# Patient Record
Sex: Male | Born: 1985 | Race: White | Hispanic: No | Marital: Single | Smoking: Current every day smoker
Health system: Southern US, Community
[De-identification: ages and names within clinical notes are randomized; demographics above are authoritative.]

## PROBLEM LIST (undated history)

## (undated) DIAGNOSIS — F1120 Opioid dependence, uncomplicated: Secondary | ICD-10-CM

## (undated) DIAGNOSIS — I2699 Other pulmonary embolism without acute cor pulmonale: Secondary | ICD-10-CM

## (undated) DIAGNOSIS — R569 Unspecified convulsions: Secondary | ICD-10-CM

## (undated) DIAGNOSIS — F1010 Alcohol abuse, uncomplicated: Secondary | ICD-10-CM

## (undated) DIAGNOSIS — F5104 Psychophysiologic insomnia: Secondary | ICD-10-CM

## (undated) HISTORY — PX: MIDDLE EAR SURGERY: SHX713

## (undated) HISTORY — DX: Psychophysiologic insomnia: F51.04

---
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
## 2009-05-30 ENCOUNTER — Emergency Department (HOSPITAL_COMMUNITY): Admission: EM | Admit: 2009-05-30 | Discharge: 2009-05-30 | Payer: Self-pay | Admitting: Emergency Medicine

## 2009-06-05 ENCOUNTER — Ambulatory Visit (HOSPITAL_COMMUNITY): Admission: RE | Admit: 2009-06-05 | Discharge: 2009-06-05 | Payer: Self-pay | Admitting: Oral Surgery

## 2009-06-25 ENCOUNTER — Ambulatory Visit (HOSPITAL_COMMUNITY): Admission: RE | Admit: 2009-06-25 | Discharge: 2009-06-25 | Payer: Self-pay | Admitting: Oral Surgery

## 2009-07-31 ENCOUNTER — Ambulatory Visit: Payer: Self-pay | Admitting: Diagnostic Radiology

## 2009-07-31 ENCOUNTER — Emergency Department (HOSPITAL_BASED_OUTPATIENT_CLINIC_OR_DEPARTMENT_OTHER): Admission: EM | Admit: 2009-07-31 | Discharge: 2009-07-31 | Payer: Self-pay | Admitting: Emergency Medicine

## 2009-08-05 ENCOUNTER — Encounter: Payer: Self-pay | Admitting: Internal Medicine

## 2009-08-20 ENCOUNTER — Ambulatory Visit: Payer: Self-pay | Admitting: Internal Medicine

## 2009-08-20 DIAGNOSIS — G40909 Epilepsy, unspecified, not intractable, without status epilepticus: Secondary | ICD-10-CM

## 2009-08-20 DIAGNOSIS — R079 Chest pain, unspecified: Secondary | ICD-10-CM

## 2009-08-20 DIAGNOSIS — F17200 Nicotine dependence, unspecified, uncomplicated: Secondary | ICD-10-CM

## 2009-08-20 DIAGNOSIS — Z8669 Personal history of other diseases of the nervous system and sense organs: Secondary | ICD-10-CM | POA: Insufficient documentation

## 2009-08-20 DIAGNOSIS — R030 Elevated blood-pressure reading, without diagnosis of hypertension: Secondary | ICD-10-CM

## 2009-08-24 ENCOUNTER — Emergency Department (HOSPITAL_COMMUNITY): Admission: EM | Admit: 2009-08-24 | Discharge: 2009-08-24 | Payer: Self-pay | Admitting: Family Medicine

## 2009-08-24 ENCOUNTER — Ambulatory Visit: Payer: Self-pay | Admitting: Cardiology

## 2009-08-24 ENCOUNTER — Ambulatory Visit (HOSPITAL_COMMUNITY): Admission: RE | Admit: 2009-08-24 | Discharge: 2009-08-24 | Payer: Self-pay | Admitting: Internal Medicine

## 2009-08-24 ENCOUNTER — Ambulatory Visit: Payer: Self-pay

## 2009-08-24 ENCOUNTER — Encounter: Payer: Self-pay | Admitting: Internal Medicine

## 2009-08-25 ENCOUNTER — Emergency Department (HOSPITAL_COMMUNITY): Admission: EM | Admit: 2009-08-25 | Discharge: 2009-08-25 | Payer: Self-pay | Admitting: Emergency Medicine

## 2010-01-27 ENCOUNTER — Emergency Department (HOSPITAL_COMMUNITY)
Admission: EM | Admit: 2010-01-27 | Discharge: 2010-01-27 | Payer: Self-pay | Source: Home / Self Care | Admitting: Family Medicine

## 2010-06-08 NOTE — Letter (Signed)
Summary: [facility] Office Note  [facility] Office Note   Imported By: Roderic Ovens 09/04/2009 11:46:49  _____________________________________________________________________  External Attachment:    Type:   Image     Comment:   External Document
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
## 2010-07-11 ENCOUNTER — Emergency Department (HOSPITAL_COMMUNITY)
Admission: EM | Admit: 2010-07-11 | Discharge: 2010-07-11 | Disposition: A | Discharge status: Home / Self Care | Payer: Self-pay | Attending: Emergency Medicine | Admitting: Emergency Medicine

## 2010-07-11 ENCOUNTER — Emergency Department (HOSPITAL_COMMUNITY): Payer: Self-pay

## 2010-07-11 DIAGNOSIS — X500XXA Overexertion from strenuous movement or load, initial encounter: Secondary | ICD-10-CM | POA: Insufficient documentation

## 2010-07-11 DIAGNOSIS — S93609A Unspecified sprain of unspecified foot, initial encounter: Secondary | ICD-10-CM | POA: Insufficient documentation

## 2010-07-26 LAB — CBC
MCHC: 34.5 g/dL (ref 30.0–36.0)
MCV: 92.6 fL (ref 78.0–100.0)
RBC: 4.82 MIL/uL (ref 4.22–5.81)
WBC: 9.1 10*3/uL (ref 4.0–10.5)

## 2010-07-26 LAB — BASIC METABOLIC PANEL
CO2: 26 mEq/L (ref 19–32)
Chloride: 102 mEq/L (ref 96–112)
Sodium: 136 mEq/L (ref 135–145)

## 2010-07-27 LAB — DIFFERENTIAL
Basophils Absolute: 0 10*3/uL (ref 0.0–0.1)
Eosinophils Absolute: 0.1 10*3/uL (ref 0.0–0.7)
Eosinophils Relative: 1 % (ref 0–5)
Lymphocytes Relative: 18 % (ref 12–46)
Lymphs Abs: 1.3 10*3/uL (ref 0.7–4.0)
Monocytes Absolute: 0.7 10*3/uL (ref 0.1–1.0)
Monocytes Relative: 9 % (ref 3–12)

## 2010-07-27 LAB — URINALYSIS, ROUTINE W REFLEX MICROSCOPIC
Bilirubin Urine: NEGATIVE
Glucose, UA: NEGATIVE mg/dL
Ketones, ur: NEGATIVE mg/dL
Leukocytes, UA: NEGATIVE
Specific Gravity, Urine: 1.02 (ref 1.005–1.030)
pH: 7 (ref 5.0–8.0)

## 2010-07-27 LAB — CBC
HCT: 42.7 % (ref 39.0–52.0)
MCV: 91 fL (ref 78.0–100.0)
RBC: 4.69 MIL/uL (ref 4.22–5.81)
WBC: 7.3 10*3/uL (ref 4.0–10.5)

## 2010-07-27 LAB — RAPID URINE DRUG SCREEN, HOSP PERFORMED
Amphetamines: NOT DETECTED
Barbiturates: NOT DETECTED
Benzodiazepines: POSITIVE — AB

## 2010-07-27 LAB — COMPREHENSIVE METABOLIC PANEL
AST: 30 U/L (ref 0–37)
Alkaline Phosphatase: 43 U/L (ref 39–117)
CO2: 27 mEq/L (ref 19–32)
Chloride: 106 mEq/L (ref 96–112)
Creatinine, Ser: 0.98 mg/dL (ref 0.4–1.5)
Total Bilirubin: 0.8 mg/dL (ref 0.3–1.2)

## 2010-08-02 LAB — CBC
MCHC: 34.2 g/dL (ref 30.0–36.0)
Platelets: 255 10*3/uL (ref 150–400)
RDW: 12.6 % (ref 11.5–15.5)

## 2010-08-02 LAB — POCT TOXICOLOGY PANEL

## 2010-08-02 LAB — BASIC METABOLIC PANEL
CO2: 26 mEq/L (ref 19–32)
Calcium: 9.7 mg/dL (ref 8.4–10.5)
Creatinine, Ser: 1 mg/dL (ref 0.4–1.5)
GFR calc Af Amer: >60 mL/min (ref >60)
Glucose, Bld: 104 mg/dL — ABNORMAL HIGH (ref 70–99)

## 2010-08-02 LAB — DIFFERENTIAL
Basophils Absolute: 0 10*3/uL (ref 0.0–0.1)
Basophils Relative: 1 % (ref 0–1)
Neutro Abs: 5.8 10*3/uL (ref 1.7–7.7)
Neutrophils Relative %: 76 % (ref 43–77)

## 2010-08-02 LAB — ETHANOL: Alcohol, Ethyl (B): <5 mg/dL (ref 0–10)

## 2012-05-07 ENCOUNTER — Encounter (HOSPITAL_COMMUNITY): Payer: Self-pay | Admitting: *Deleted

## 2012-05-07 ENCOUNTER — Emergency Department (HOSPITAL_COMMUNITY)
Admission: EM | Admit: 2012-05-07 | Discharge: 2012-05-07 | Disposition: A | Discharge status: Home / Self Care | Payer: Self-pay | Attending: Emergency Medicine | Admitting: Emergency Medicine

## 2012-05-07 ENCOUNTER — Emergency Department (HOSPITAL_COMMUNITY): Payer: Self-pay

## 2012-05-07 DIAGNOSIS — Z8669 Personal history of other diseases of the nervous system and sense organs: Secondary | ICD-10-CM | POA: Insufficient documentation

## 2012-05-07 DIAGNOSIS — S6291XA Unspecified fracture of right wrist and hand, initial encounter for closed fracture: Secondary | ICD-10-CM

## 2012-05-07 DIAGNOSIS — Y929 Unspecified place or not applicable: Secondary | ICD-10-CM | POA: Insufficient documentation

## 2012-05-07 DIAGNOSIS — F17200 Nicotine dependence, unspecified, uncomplicated: Secondary | ICD-10-CM | POA: Insufficient documentation

## 2012-05-07 DIAGNOSIS — S62309A Unspecified fracture of unspecified metacarpal bone, initial encounter for closed fracture: Secondary | ICD-10-CM | POA: Insufficient documentation

## 2012-05-07 DIAGNOSIS — W2209XA Striking against other stationary object, initial encounter: Secondary | ICD-10-CM | POA: Insufficient documentation

## 2012-05-07 DIAGNOSIS — Y9389 Activity, other specified: Secondary | ICD-10-CM | POA: Insufficient documentation

## 2012-05-07 HISTORY — DX: Unspecified convulsions: R56.9

## 2012-05-07 MED ORDER — IBUPROFEN 800 MG PO TABS
800.0000 mg | ORAL_TABLET | Freq: Once | ORAL | Status: AC
  Administered 2012-05-07: 800 mg via ORAL
  Filled 2012-05-07: qty 1
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
## 2013-02-06 ENCOUNTER — Encounter (HOSPITAL_COMMUNITY): Payer: Self-pay | Admitting: *Deleted

## 2013-02-06 ENCOUNTER — Emergency Department (HOSPITAL_COMMUNITY)
Admission: EM | Admit: 2013-02-06 | Discharge: 2013-02-06 | Disposition: A | Discharge status: Home / Self Care | Payer: Self-pay | Attending: Emergency Medicine | Admitting: Emergency Medicine

## 2013-02-06 DIAGNOSIS — K029 Dental caries, unspecified: Secondary | ICD-10-CM | POA: Insufficient documentation

## 2013-02-06 DIAGNOSIS — K089 Disorder of teeth and supporting structures, unspecified: Secondary | ICD-10-CM | POA: Insufficient documentation

## 2013-02-06 DIAGNOSIS — F17200 Nicotine dependence, unspecified, uncomplicated: Secondary | ICD-10-CM | POA: Insufficient documentation

## 2013-02-06 DIAGNOSIS — K0889 Other specified disorders of teeth and supporting structures: Secondary | ICD-10-CM

## 2013-02-06 DIAGNOSIS — Z8669 Personal history of other diseases of the nervous system and sense organs: Secondary | ICD-10-CM | POA: Insufficient documentation

## 2013-02-06 DIAGNOSIS — Z880 Allergy status to penicillin: Secondary | ICD-10-CM | POA: Insufficient documentation

## 2013-02-06 MED ORDER — CLINDAMYCIN HCL 150 MG PO CAPS: 300.0000 mg | ORAL_CAPSULE | Freq: Four times a day (QID) | ORAL | Status: DC

## 2013-02-06 MED ORDER — IBUPROFEN 800 MG PO TABS: 800.0000 mg | ORAL_TABLET | Freq: Three times a day (TID) | ORAL | Status: DC

## 2013-02-06 NOTE — ED Notes (Signed)
Pt with left dental pain x 2 weeks, states pain radiates to left jaw and ear

## 2013-02-06 NOTE — ED Provider Notes (Signed)
CSN: [id number]     Arrival date & time 02/06/13  1241 History   First MD Initiated Contact with Patient 02/06/13 1317     Chief Complaint  Patient presents with  . Dental Pain   (Consider location/radiation/quality/duration/timing/severity/associated sxs/prior Treatment) Patient is a 27 y.o. male presenting with tooth pain. The history is provided by the patient.  Dental Pain Location:  Lower Lower teeth location:  18/LL 2nd molar Quality:  Dull and throbbing Severity:  Moderate Onset quality:  Gradual Duration:  2 weeks Timing:  Constant Progression:  Worsening Chronicity:  New Context: dental caries and poor dentition   Context: not abscess, filling still in place, not recent dental surgery and not trauma   Relieved by:  Nothing Worsened by:  Cold food/drink and hot food/drink Ineffective treatments:  Acetaminophen Associated symptoms: no congestion, no difficulty swallowing, no facial pain, no facial swelling, no fever, no gum swelling, no headaches, no neck pain, no neck swelling, no oral lesions and no trismus   Associated symptoms comment:  Left ear pain Risk factors: lack of dental care, periodontal disease and smoking   Risk factors: no diabetes     Past Medical History  Diagnosis Date  . Seizures    Past Surgical History  Procedure Laterality Date  . Middle ear surgery     History reviewed. No pertinent family history. History  Substance Use Topics  . Smoking status: Current Every Day Smoker    Types: Cigarettes  . Smokeless tobacco: Not on file  . Alcohol Use: No    Review of Systems  Constitutional: Negative for fever and appetite change.  HENT: Positive for dental problem. Negative for congestion, sore throat, facial swelling, mouth sores, trouble swallowing, neck pain and neck stiffness.   Eyes: Negative for pain and visual disturbance.  Neurological: Negative for dizziness, facial asymmetry and headaches.  Hematological: Negative for adenopathy.    All other systems reviewed and are negative.    Allergies  Penicillins  Home Medications  No current outpatient prescriptions on file. BP 145/81  Pulse 73  Temp(Src) 98.1 F (36.7 C) (Oral)  Resp 18  Ht 5\' 11"  (1.803 m)  Wt 190 lb (86.183 kg)  BMI 26.51 kg/m2  SpO2 100% Physical Exam  Nursing note and vitals reviewed. Constitutional: He is oriented to person, place, and time. He appears well-developed and well-nourished. No distress.  HENT:  Head: Normocephalic and atraumatic.  Right Ear: Tympanic membrane and ear canal normal.  Left Ear: Tympanic membrane and ear canal normal.  Mouth/Throat: Uvula is midline, oropharynx is clear and moist and mucous membranes are normal. No trismus in the jaw. Dental caries present. No dental abscesses or edematous.    Dental caries of the left lower molar.  No facial swelling, obvious dental abscess, trismus, or sublingual abnml.    Neck: Normal range of motion. Neck supple.  Cardiovascular: Normal rate, regular rhythm and normal heart sounds.   No murmur heard. Pulmonary/Chest: Effort normal and breath sounds normal.  Musculoskeletal: Normal range of motion.  Lymphadenopathy:    He has no cervical adenopathy.  Neurological: He is alert and oriented to person, place, and time. He exhibits normal muscle tone. Coordination normal.  Skin: Skin is warm and dry.    ED Course  Procedures (including critical care time) Labs Review Labs Reviewed - No data to display Imaging Review No results found.  MDM   Vital signs are stable. Patient will appearing. Stable for discharge at this time. Given referral information  for dental followup.  No concerning sx's for Ludwig's angina.  Prescribed ibuprofen 800mg  and clindamycin   Alece Koppel L. Trisha Mangle, PA-C 02/06/13 2047

## 2013-02-09 NOTE — ED Provider Notes (Signed)
Medical screening examination/treatment/procedure(s) were performed by non-physician practitioner and as supervising physician I was immediately available for consultation/collaboration.  Abdifatah Colquhoun M Kassidee Narciso, MD 02/09/13 0858 

## 2014-09-10 ENCOUNTER — Emergency Department (HOSPITAL_COMMUNITY)
Admission: EM | Admit: 2014-09-10 | Discharge: 2014-09-10 | Disposition: A | Discharge status: Home / Self Care | Payer: 59 | Attending: Emergency Medicine | Admitting: Emergency Medicine

## 2014-09-10 ENCOUNTER — Encounter (HOSPITAL_COMMUNITY): Payer: Self-pay

## 2014-09-10 DIAGNOSIS — R569 Unspecified convulsions: Secondary | ICD-10-CM | POA: Diagnosis present

## 2014-09-10 DIAGNOSIS — Z720 Tobacco use: Secondary | ICD-10-CM | POA: Insufficient documentation

## 2014-09-10 DIAGNOSIS — Z791 Long term (current) use of non-steroidal anti-inflammatories (NSAID): Secondary | ICD-10-CM | POA: Insufficient documentation

## 2014-09-10 DIAGNOSIS — Z792 Long term (current) use of antibiotics: Secondary | ICD-10-CM | POA: Insufficient documentation

## 2014-09-10 DIAGNOSIS — G40909 Epilepsy, unspecified, not intractable, without status epilepticus: Secondary | ICD-10-CM | POA: Insufficient documentation

## 2014-09-10 DIAGNOSIS — Z880 Allergy status to penicillin: Secondary | ICD-10-CM | POA: Diagnosis not present

## 2014-09-10 LAB — URINALYSIS, ROUTINE W REFLEX MICROSCOPIC
BILIRUBIN URINE: NEGATIVE
GLUCOSE, UA: NEGATIVE mg/dL
Leukocytes, UA: NEGATIVE
Nitrite: NEGATIVE
PH: 6 (ref 5.0–8.0)
Protein, ur: 100 mg/dL — AB
Specific Gravity, Urine: >1.03 — ABNORMAL HIGH (ref 1.005–1.030)
Urobilinogen, UA: 0.2 mg/dL (ref 0.0–1.0)

## 2014-09-10 LAB — CBC WITH DIFFERENTIAL/PLATELET
Basophils Absolute: 0 10*3/uL (ref 0.0–0.1)
Basophils Relative: 0 % (ref 0–1)
Eosinophils Absolute: 0 10*3/uL (ref 0.0–0.7)
Eosinophils Relative: 0 % (ref 0–5)
HCT: 46.3 % (ref 39.0–52.0)
Hemoglobin: 16.1 g/dL (ref 13.0–17.0)
LYMPHS ABS: 0.9 10*3/uL (ref 0.7–4.0)
LYMPHS PCT: 8 % — AB (ref 12–46)
MCH: 31.4 pg (ref 26.0–34.0)
MCHC: 34.8 g/dL (ref 30.0–36.0)
MCV: 90.4 fL (ref 78.0–100.0)
Monocytes Absolute: 0.8 10*3/uL (ref 0.1–1.0)
Monocytes Relative: 7 % (ref 3–12)
NEUTROS PCT: 85 % — AB (ref 43–77)
Neutro Abs: 9.8 10*3/uL — ABNORMAL HIGH (ref 1.7–7.7)
PLATELETS: 233 10*3/uL (ref 150–400)
RBC: 5.12 MIL/uL (ref 4.22–5.81)
RDW: 13.1 % (ref 11.5–15.5)
WBC: 11.6 10*3/uL — ABNORMAL HIGH (ref 4.0–10.5)

## 2014-09-10 LAB — COMPREHENSIVE METABOLIC PANEL
ALBUMIN: 4.7 g/dL (ref 3.5–5.0)
ALK PHOS: 38 U/L (ref 38–126)
ALT: 23 U/L (ref 17–63)
ANION GAP: 8 (ref 5–15)
AST: 30 U/L (ref 15–41)
BUN: 13 mg/dL (ref 6–20)
CO2: 25 mmol/L (ref 22–32)
Calcium: 9.2 mg/dL (ref 8.9–10.3)
Chloride: 105 mmol/L (ref 101–111)
Creatinine, Ser: 1.01 mg/dL (ref 0.61–1.24)
GFR calc Af Amer: >60 mL/min (ref >60)
GFR calc non Af Amer: >60 mL/min (ref >60)
Glucose, Bld: 137 mg/dL — ABNORMAL HIGH (ref 70–99)
POTASSIUM: 4.2 mmol/L (ref 3.5–5.1)
SODIUM: 138 mmol/L (ref 135–145)
TOTAL PROTEIN: 7.2 g/dL (ref 6.5–8.1)
Total Bilirubin: 1 mg/dL (ref 0.3–1.2)

## 2014-09-10 LAB — URINE MICROSCOPIC-ADD ON

## 2014-09-10 LAB — CBG MONITORING, ED: Glucose-Capillary: 131 mg/dL — ABNORMAL HIGH (ref 70–99)

## 2014-09-10 MED ORDER — PHENYTOIN SODIUM EXTENDED 100 MG PO CAPS: 300.0000 mg | ORAL_CAPSULE | Freq: Every day | ORAL | Status: DC

## 2014-09-10 MED ORDER — PHENYTOIN SODIUM EXTENDED 100 MG PO CAPS
400.0000 mg | ORAL_CAPSULE | ORAL | Status: DC
  Administered 2014-09-10: 400 mg via ORAL
  Filled 2014-09-10: qty 4

## 2014-09-10 NOTE — ED Provider Notes (Signed)
CSN: [id number]     Arrival date & time 09/10/14  1344 History   First MD Initiated Contact with Patient 09/10/14 1409     Chief Complaint  Patient presents with  . Seizures     Patient is a 29 y.o. male presenting with seizures. The history is provided by the patient. No language interpreter was used.  Seizures  Ryan Fernandez has a history of seizure disorder. Today he presents for 3 seizures. He had one this morning around 8 AM that was witnessed by his girlfriend. The description is that it was a generalized seizure where he struck his head. It is unclear how long it lasted. He did not have any tongue biting or incontinence. He reports feeling sore all over. About an hour later he had one to 2 more seizures. These were unwitnessed. He is supposed to be on Dilantin but that stopped that about a year ago due to financial problems. He has a history of seizure disorder since the age of 29. His last seizure was 2 years ago. He reports no recent illnesses but has been under increased stress and is sleeping less lately. He has a regular alcohol drinker and drinks about a 6 pack daily. He has no history of alcohol withdrawal.  Past Medical History  Diagnosis Date  . Seizures    Past Surgical History  Procedure Laterality Date  . Middle ear surgery     History reviewed. No pertinent family history. History  Substance Use Topics  . Smoking status: Current Every Day Smoker    Types: Cigarettes  . Smokeless tobacco: Not on file  . Alcohol Use: Yes     Comment: daily 6 packs - but none since Monday    Review of Systems  Neurological: Positive for seizures.  All other systems reviewed and are negative.     Allergies  Penicillins  Home Medications   Prior to Admission medications   Medication Sig Start Date End Date Taking? Authorizing Provider  Buprenorphine HCl-Naloxone HCl (SUBOXONE SL) Place under the tongue.   Yes Historical Provider, MD  Escitalopram Oxalate (LEXAPRO PO) Take by  mouth.   Yes Historical Provider, MD  TraZODone & Diet Manage Prod (TRAZAMINE PO) Take by mouth.   Yes Historical Provider, MD  clindamycin (CLEOCIN) 150 MG capsule Take 2 capsules (300 mg total) by mouth 4 (four) times daily. 02/06/13   Tammy Triplett, PA-C  ibuprofen (ADVIL,MOTRIN) 800 MG tablet Take 1 tablet (800 mg total) by mouth 3 (three) times daily. 02/06/13   Tammy Triplett, PA-C   BP 136/91 mmHg  Pulse 84  Temp(Src) 98.2 F (36.8 C) (Oral)  Resp 16  Ht 5\' 11"  (1.803 m)  Wt 170 lb (77.111 kg)  BMI 23.72 kg/m2  SpO2 97% Physical Exam  Constitutional: He is oriented to person, place, and time. He appears well-developed and well-nourished.  HENT:  Head: Normocephalic and atraumatic.  Mouth/Throat: Oropharynx is clear and moist.  Eyes: EOM are normal. Pupils are equal, round, and reactive to light.  Cardiovascular: Normal rate and regular rhythm.   No murmur heard. Pulmonary/Chest: Effort normal and breath sounds normal. No respiratory distress.  Abdominal: Soft. There is no tenderness. There is no rebound and no guarding.  Musculoskeletal: He exhibits no edema or tenderness.  Neurological: He is alert and oriented to person, place, and time. No cranial nerve deficit.  5 out of 5 strength in all 4 extremities.  Skin: Skin is warm and dry.  Psychiatric: He has a  normal mood and affect. His behavior is normal.  Nursing note and vitals reviewed.   ED Course  Procedures (including critical care time) Labs Review Labs Reviewed  COMPREHENSIVE METABOLIC PANEL - Abnormal; Notable for the following:    Glucose, Bld 137 (*)    All other components within normal limits  CBC WITH DIFFERENTIAL/PLATELET - Abnormal; Notable for the following:    WBC 11.6 (*)    Neutrophils Relative % 85 (*)    Neutro Abs 9.8 (*)    Lymphocytes Relative 8 (*)    All other components within normal limits  CBG MONITORING, ED - Abnormal; Notable for the following:    Glucose-Capillary 131 (*)    All  other components within normal limits  URINALYSIS, ROUTINE W REFLEX MICROSCOPIC    Imaging Review No results found.   EKG Interpretation   Date/Time:  Wednesday Sep 10 2014 13:54:34 EDT Ventricular Rate:  74 PR Interval:  170 QRS Duration: 92 QT Interval:  388 QTC Calculation: 430 R Axis:   71 Text Interpretation:  Sinus rhythm Confirmed by Ryan Brighamees, Ryan Fernandez [phone]([id number]) on  09/10/2014 2:10:24 PM      MDM   Final diagnoses:  Seizure disorder    Patient here for evaluation of seizure, history of seizure disorder and not taking anticonvulsants. Patient has no evidence of acute significant trauma on examination. Plan to restart his Dilantin was loading the emergency department. Checking labs to evaluate for electrolyte abnormalities.  CMP with no major abnormalities. Patient without any complaints on repeat check. Discussed with patient oral Dilantin loading with outpatient follow-up. Discussed needing recheck of his Dilantin level in the next week. Return precautions were discussed. Discussed with patient that he is not able to drive and he needs to follow seizure precautions until he has been cleared by his primary care physician and has his Dilantin level rechecked.   Tilden FossaElizabeth Deionna Marcantonio, MD 09/10/14 [phone]

## 2014-09-10 NOTE — Discharge Instructions (Signed)

## 2014-09-10 NOTE — ED Notes (Addendum)
EMS reports family called EMS for possible seizure, ? Hitting head - pt semi-post ictal on arrival. Pt alert, oriented, appropriate, verbal at this time. Pt states he feels better at this time but reports he had three seizures this morning = but had otherwise been seizure free for 2 years - pt reports being on Dilantin in the past, states he quit taking it due to personal reasons.  PT with 18G to LAC by EMS that is patent, flushes well, no edema, no redness. Reports being under a lot of stress with current relationship. PT also reports he has been taking one xanax daily at work x1 month but has not had one in a week.

## 2014-09-10 NOTE — Progress Notes (Signed)
MEDICATION RELATED CONSULT NOTE - INITIAL   Pharmacy Consult for Dilantin Indication: Seizure disorder  Allergies  Allergen Reactions  . Penicillins    Patient Measurements: Height: 5\' 11"  (180.3 cm) Weight: 170 lb (77.111 kg) IBW/kg (Calculated) : 75.3  Vital Signs: Temp: 98.2 F (36.8 C) (05/04 1349) Temp Source: Oral (05/04 1349) BP: 136/91 mmHg (05/04 1349) Pulse Rate: 84 (05/04 1349) Intake/Output from previous day:   Intake/Output from this shift:    Labs: No results for input(s): WBC, HGB, HCT, PLT, APTT, CREATININE, LABCREA, CREATININE, CREAT24HRUR, MG, PHOS, ALBUMIN, PROT, ALBUMIN, AST, ALT, ALKPHOS, BILITOT, BILIDIR, IBILI in the last 72 hours. CrCl cannot be calculated (Patient has no serum creatinine result on file.).  Microbiology: No results found for this or any previous visit (from the past 720 hour(s)).  Medical History: Past Medical History  Diagnosis Date  . Seizures    Medications:   (Not in a hospital admission)  Assessment: Ryan Fernandez with h/o seizure disorder.  Brought to ED today by EMS for possible seizure.  Pt states he had 3 seizures this morning but prior to this has been seizure free for 2 years.  Pt reports having been on Dilantin in the past but quit taking it due to personal reasons.   Labs on order > Pending  Goal of Therapy:  Corrected Dilantin level 10-20 mcg/ml  Plan:  Dilantin Loading dose PO: 400mg  PO q3h x 3 doses (1200mg  total = ~ 15mg /Kg) then Dilantin 300mg  PO daily at bedtime starting TOMORROW (09/11/14) Recommend checking a Dilantin level and Albumin in 5-7 days F/U labs, LFTs, Renal fxn, albumin  Margo AyeHall, Khamari Yousuf A 09/10/2014,2:29 PM

## 2014-11-16 ENCOUNTER — Emergency Department (HOSPITAL_COMMUNITY): Payer: 59

## 2014-11-16 ENCOUNTER — Encounter (HOSPITAL_COMMUNITY): Payer: Self-pay | Admitting: *Deleted

## 2014-11-16 ENCOUNTER — Emergency Department (HOSPITAL_COMMUNITY)
Admission: EM | Admit: 2014-11-16 | Discharge: 2014-11-18 | Disposition: A | Discharge status: Home / Self Care | Payer: 59 | Attending: Emergency Medicine | Admitting: Emergency Medicine

## 2014-11-16 DIAGNOSIS — G40909 Epilepsy, unspecified, not intractable, without status epilepticus: Secondary | ICD-10-CM | POA: Diagnosis not present

## 2014-11-16 DIAGNOSIS — Z880 Allergy status to penicillin: Secondary | ICD-10-CM | POA: Insufficient documentation

## 2014-11-16 DIAGNOSIS — Z008 Encounter for other general examination: Secondary | ICD-10-CM | POA: Diagnosis present

## 2014-11-16 DIAGNOSIS — Z79899 Other long term (current) drug therapy: Secondary | ICD-10-CM | POA: Diagnosis not present

## 2014-11-16 DIAGNOSIS — F10129 Alcohol abuse with intoxication, unspecified: Secondary | ICD-10-CM | POA: Diagnosis not present

## 2014-11-16 DIAGNOSIS — F1310 Sedative, hypnotic or anxiolytic abuse, uncomplicated: Secondary | ICD-10-CM | POA: Insufficient documentation

## 2014-11-16 DIAGNOSIS — Z720 Tobacco use: Secondary | ICD-10-CM | POA: Insufficient documentation

## 2014-11-16 DIAGNOSIS — F10929 Alcohol use, unspecified with intoxication, unspecified: Secondary | ICD-10-CM

## 2014-11-16 DIAGNOSIS — F1210 Cannabis abuse, uncomplicated: Secondary | ICD-10-CM | POA: Diagnosis not present

## 2014-11-16 DIAGNOSIS — E162 Hypoglycemia, unspecified: Secondary | ICD-10-CM | POA: Insufficient documentation

## 2014-11-16 LAB — RAPID URINE DRUG SCREEN, HOSP PERFORMED
Amphetamines: NOT DETECTED
BARBITURATES: NOT DETECTED
Benzodiazepines: POSITIVE — AB
Cocaine: NOT DETECTED
Opiates: NOT DETECTED
Tetrahydrocannabinol: POSITIVE — AB

## 2014-11-16 LAB — CBC WITH DIFFERENTIAL/PLATELET
Basophils Absolute: 0 10*3/uL (ref 0.0–0.1)
Basophils Relative: 0 % (ref 0–1)
Eosinophils Absolute: 0.1 10*3/uL (ref 0.0–0.7)
Eosinophils Relative: 1 % (ref 0–5)
HCT: 43.3 % (ref 39.0–52.0)
HEMOGLOBIN: 14.9 g/dL (ref 13.0–17.0)
LYMPHS ABS: 2.2 10*3/uL (ref 0.7–4.0)
LYMPHS PCT: 33 % (ref 12–46)
MCH: 31.6 pg (ref 26.0–34.0)
MCHC: 34.4 g/dL (ref 30.0–36.0)
MCV: 91.9 fL (ref 78.0–100.0)
MONO ABS: 0.8 10*3/uL (ref 0.1–1.0)
MONOS PCT: 11 % (ref 3–12)
NEUTROS ABS: 3.6 10*3/uL (ref 1.7–7.7)
Neutrophils Relative %: 55 % (ref 43–77)
Platelets: 256 10*3/uL (ref 150–400)
RBC: 4.71 MIL/uL (ref 4.22–5.81)
RDW: 13.7 % (ref 11.5–15.5)
WBC: 6.7 10*3/uL (ref 4.0–10.5)

## 2014-11-16 LAB — BASIC METABOLIC PANEL
ANION GAP: 9 (ref 5–15)
BUN: 9 mg/dL (ref 6–20)
CALCIUM: 8.5 mg/dL — AB (ref 8.9–10.3)
CO2: 26 mmol/L (ref 22–32)
CREATININE: 0.94 mg/dL (ref 0.61–1.24)
Chloride: 108 mmol/L (ref 101–111)
GFR calc Af Amer: >60 mL/min (ref >60)
Glucose, Bld: 88 mg/dL (ref 65–99)
Potassium: 3.6 mmol/L (ref 3.5–5.1)
SODIUM: 143 mmol/L (ref 135–145)

## 2014-11-16 LAB — ETHANOL: Alcohol, Ethyl (B): 265 mg/dL — ABNORMAL HIGH (ref <5)

## 2014-11-16 LAB — CBG MONITORING, ED
GLUCOSE-CAPILLARY: 58 mg/dL — AB (ref 65–99)
Glucose-Capillary: 113 mg/dL — ABNORMAL HIGH (ref 65–99)

## 2014-11-16 MED ORDER — SODIUM CHLORIDE 0.9 % IV BOLUS (SEPSIS)
1000.0000 mL | Freq: Once | INTRAVENOUS | Status: AC
  Administered 2014-11-16: 1000 mL via INTRAVENOUS

## 2014-11-16 MED ORDER — DEXTROSE 50 % IV SOLN
25.0000 g | Freq: Once | INTRAVENOUS | Status: AC
  Administered 2014-11-16: 25 g via INTRAVENOUS

## 2014-11-16 MED ORDER — NALOXONE HCL 0.4 MG/ML IJ SOLN
0.4000 mg | Freq: Once | INTRAMUSCULAR | Status: AC
  Administered 2014-11-16: 0.4 mg via INTRAVENOUS
  Filled 2014-11-16: qty 1

## 2014-11-16 MED ORDER — DEXTROSE 50 % IV SOLN
INTRAVENOUS | Status: AC
  Filled 2014-11-16: qty 50

## 2014-11-16 MED ORDER — AMMONIA AROMATIC IN INHA
RESPIRATORY_TRACT | Status: AC
  Filled 2014-11-16: qty 10

## 2014-11-16 NOTE — ED Notes (Signed)
Pt brought in by [facility] for wanting help to get off prescription meds; pt told ems that he had took sobaxin for 7 days and has been with out any meds for the last 7 days as well; ems states pt was alert and oriented when he was picked up but upon arrival to ED pt is non-verbal, unresponsive and responsive only to ammonia inhalent;

## 2014-11-16 NOTE — ED Notes (Signed)
Pt given crackers, peanut butter & a drink after CBG reading of 58.

## 2014-11-16 NOTE — ED Notes (Signed)
White substance noted in the right nare.

## 2014-11-16 NOTE — ED Provider Notes (Addendum)
CSN: [id number]     Arrival date & time 11/16/14  1940 History   First MD Initiated Contact with Patient 11/16/14 1943     Chief Complaint  Patient presents with  . Medical Clearance     (Consider location/radiation/quality/duration/timing/severity/associated sxs/prior Treatment) HPI..... Level V caveat for altered mental status. According to EMS report, patient had been on Ativan, Versed, unknown pain medicine. This was discontinued recently and patient was put on Suboxone. It is uncertain whether he is still taking Suboxone. He did claim to take Xanax today. Uncertain alcohol history. Past medical history unknown  Past Medical History  Diagnosis Date  . Seizures    Past Surgical History  Procedure Laterality Date  . Middle ear surgery     History reviewed. No pertinent family history. History  Substance Use Topics  . Smoking status: Current Every Day Smoker    Types: Cigarettes  . Smokeless tobacco: Not on file  . Alcohol Use: Yes     Comment: daily 6 packs - but none since Monday    Review of Systems  Unable to perform ROS: Mental status change      Allergies  Penicillins  Home Medications   Prior to Admission medications   Medication Sig Start Date End Date Taking? Authorizing Provider  phenytoin (DILANTIN) 100 MG ER capsule Take 3 capsules (300 mg total) by mouth at bedtime. Today take four capsules when you fill the prescription and four capsules three hours later.  Tomorrow start taking 3 capsules (300mg ) by mouth every night. 09/10/14  Yes Tilden FossaElizabeth Rees, MD  Buprenorphine HCl-Naloxone HCl 8-2 MG FILM Place 1 Film under the tongue daily.    Historical Provider, MD  cloNIDine (CATAPRES) 0.1 MG tablet Take 0.1 mg by mouth 2 (two) times daily. 11/06/14   Historical Provider, MD  escitalopram (LEXAPRO) 20 MG tablet Take 20 mg by mouth daily. 08/15/14   Historical Provider, MD  ibuprofen (ADVIL,MOTRIN) 200 MG tablet Take 400-800 mg by mouth every 6 (six) hours as needed  for moderate pain.    Historical Provider, MD  naproxen sodium (ANAPROX) 220 MG tablet Take 440 mg by mouth daily as needed (pain).    Historical Provider, MD  traZODone (DESYREL) 50 MG tablet Take 50 mg by mouth at bedtime as needed for sleep.  08/15/14   Historical Provider, MD  ZUBSOLV 2.9-0.71 MG SUBL USE 1 TABLET SUBLINGUALLY ONCE A DAY 11/06/14   Historical Provider, MD   BP 119/83 mmHg  Pulse 77  Temp(Src) 97.8 F (36.6 C) (Oral)  Resp 20  Ht 5\' 11"  (1.803 m)  Wt 170 lb (77.111 kg)  BMI 23.72 kg/m2  SpO2 100% Physical Exam  Constitutional:  Patient was initially talking, but then he became sleepy and less arousable  HENT:  Head: Normocephalic and atraumatic.  Eyes: Conjunctivae are normal. Pupils are equal, round, and reactive to light.  Neck: Normal range of motion. Neck supple.  Cardiovascular: Normal rate and regular rhythm.   Pulmonary/Chest: Effort normal and breath sounds normal.  Abdominal: Soft. Bowel sounds are normal.  Musculoskeletal: Normal range of motion.  Neurological:  Unable  Skin: Skin is warm and dry.  Psychiatric:  Unable  Nursing note and vitals reviewed.   ED Course  Procedures (including critical care time) Labs Review Labs Reviewed  BASIC METABOLIC PANEL - Abnormal; Notable for the following:    Calcium 8.5 (*)    All other components within normal limits  URINE RAPID DRUG SCREEN, HOSP PERFORMED - Abnormal; Notable  for the following:    Benzodiazepines POSITIVE (*)    Tetrahydrocannabinol POSITIVE (*)    All other components within normal limits  ETHANOL - Abnormal; Notable for the following:    Alcohol, Ethyl (B) 265 (*)    All other components within normal limits  CBG MONITORING, ED - Abnormal; Notable for the following:    Glucose-Capillary 58 (*)    All other components within normal limits  CBC WITH DIFFERENTIAL/PLATELET    Imaging Review No results found.   EKG Interpretation None      MDM   Final diagnoses:  Alcohol  intoxication, with unspecified complication    Patient was initially verbal, but then he became nonverbal. Narcan given. This did not seem to help. Glucose level was 58. Intravenous glucose given. Alcohol level 265.  Drug screen positive for benzodiazepine's and THC.  I suspect patient's increased somnolence is secondary to alcohol. We'll obtain [facility] consult after patient sobers up.  CT head pending. Discussed with Dr. Anastasia Fiedler, MD 11/16/14 1610  Donnetta Hutching, MD 11/16/14 2156

## 2014-11-16 NOTE — ED Notes (Signed)
Removed 3 necklaces, watch, & 2 earrings. Clothing included jeans, socks, tennis shoes & shirt. Pt has cell phone & wallet that remain w/ pt belongings.

## 2014-11-16 NOTE — ED Notes (Addendum)
Pt states he has taken a couple of xanax today the last was at 1100, pt respond to verbal stimuli at this time. Says his Dr has taken him off all his meds. Finished the sobaxin. Wants help getting off prescription meds

## 2014-11-17 LAB — PHENYTOIN LEVEL, TOTAL

## 2014-11-17 MED ORDER — NICOTINE 14 MG/24HR TD PT24: 14.0000 mg | MEDICATED_PATCH | Freq: Once | TRANSDERMAL | Status: DC

## 2014-11-17 MED ORDER — NICOTINE 21 MG/24HR TD PT24
21.0000 mg | MEDICATED_PATCH | Freq: Once | TRANSDERMAL | Status: AC
  Administered 2014-11-17: 21 mg via TRANSDERMAL
  Filled 2014-11-17: qty 1

## 2014-11-17 MED ORDER — VITAMIN B-1 100 MG PO TABS
100.0000 mg | ORAL_TABLET | Freq: Every day | ORAL | Status: DC
Start: 2014-11-17 — End: 2014-11-18
  Administered 2014-11-17 – 2014-11-18 (×2): 100 mg via ORAL
  Filled 2014-11-17 (×2): qty 1

## 2014-11-17 MED ORDER — TRAZODONE HCL 50 MG PO TABS
50.0000 mg | ORAL_TABLET | Freq: Every evening | ORAL | Status: DC | PRN
  Administered 2014-11-17: 50 mg via ORAL
  Filled 2014-11-17: qty 1

## 2014-11-17 MED ORDER — ESCITALOPRAM OXALATE 10 MG PO TABS
20.0000 mg | ORAL_TABLET | Freq: Every day | ORAL | Status: DC
  Administered 2014-11-17 – 2014-11-18 (×2): 20 mg via ORAL
  Filled 2014-11-17: qty 1
  Filled 2014-11-17: qty 2
  Filled 2014-11-17: qty 1
  Filled 2014-11-17: qty 2

## 2014-11-17 MED ORDER — LORAZEPAM 1 MG PO TABS: 0.0000 mg | ORAL_TABLET | Freq: Two times a day (BID) | ORAL | Status: DC

## 2014-11-17 MED ORDER — LORAZEPAM 1 MG PO TABS
0.0000 mg | ORAL_TABLET | Freq: Four times a day (QID) | ORAL | Status: DC
  Administered 2014-11-17 (×2): 2 mg via ORAL
  Administered 2014-11-17: 1 mg via ORAL
  Administered 2014-11-17 – 2014-11-18 (×2): 2 mg via ORAL
  Filled 2014-11-17 (×3): qty 2
  Filled 2014-11-17: qty 1
  Filled 2014-11-17: qty 2

## 2014-11-17 MED ORDER — ACETAMINOPHEN 325 MG PO TABS
650.0000 mg | ORAL_TABLET | Freq: Once | ORAL | Status: AC
  Administered 2014-11-17: 650 mg via ORAL
  Filled 2014-11-17: qty 2

## 2014-11-17 MED ORDER — THIAMINE HCL 100 MG/ML IJ SOLN: 100.0000 mg | Freq: Every day | INTRAMUSCULAR | Status: DC

## 2014-11-17 MED ORDER — NICOTINE 21 MG/24HR TD PT24
21.0000 mg | MEDICATED_PATCH | Freq: Once | TRANSDERMAL | Status: DC
  Administered 2014-11-17: 21 mg via TRANSDERMAL
  Filled 2014-11-17: qty 1

## 2014-11-17 NOTE — BHH Counselor (Signed)
TTS Counselor sent referrals to the following facilities in effort to obtain Inpatient Detox/Suboxone/Opiate tx for pt:   [facility]

## 2014-11-17 NOTE — ED Notes (Signed)
Pt wanting to go outside & smoke. Pt informed that this is a smoke free facility & was unable to leave to smoke. Pt was asked if he would like a nicotine patch & states he would. Speaking to EDP about getting an order.
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
## 2014-11-17 NOTE — Progress Notes (Addendum)
[facility] the [facility], [facility] male beds [facility] on [facility]/18/14.  Patient was followed-up at at the following facilities: [facility] - per care coordinator Jarett, states that [facility] attending Dr. to [facility]man [facility] for doc to doc referral (due to patient having history of seizures and if patient compliant with medication)    Shift Supervisor - [phone] fax:[phone] [facility] - per Dennie BiblePat, fax referral for review. Referral faxed. [facility] intake, fax referral for review, beds open on 7/12 in am. [facility] - [facility].  Declined at: [facility] - due to hx of seisures OV - due to hx of seisures  Ryan Fernandez, LCSWA Disposition staff 11/17/2014 9:35 PM

## 2014-11-17 NOTE — ED Notes (Signed)
Pt ambulated to restroom & returned to room w/ no complications. 

## 2014-11-17 NOTE — ED Notes (Signed)
[facility] is [facility] to accept pt if he can bring medication and have a ride at discharge.  Pt verbalized that he could do both. [facility] notified.

## 2014-11-17 NOTE — ED Notes (Signed)
RN spoke with Ryan Fernandez at [facility] to answer intake questions. [phone]

## 2014-11-17 NOTE — Progress Notes (Signed)
CSW followed up on detox placement efforts.  [facility]- per Ray, refax referral to [phone] [facility]- per Sam, referral not received over weekend, [facility]ax [facility] [place], same as above [facility], same as above  [facility]- [facility] per French Anaracy  Received call from KevinAshley at [facility] requesting more medical information, directed call to [facility] charge RN.  Ilean SkillMeghan Debborah Alonge, MSW, LCSWA Clinical Social Work, Disposition  11/17/2014 [phone]

## 2014-11-17 NOTE — ED Provider Notes (Signed)
Ann, TSS, states they are going to recommend patient be admitted for detox, they will arrange. [facility].   Devoria AlbeIva Elishua Radford, MD, Concha PyoFACEP   Anabelen Kaminsky, MD 11/17/14 [phone]

## 2014-11-17 NOTE — BHH Counselor (Signed)
Disposition: Per Hulan FessIjeoma Nwaeze, NP, Pt meets inpt/detox tx criteria. TTS will seek placement for pt at appropriate detox facilities.   TTS Counselor informed Dr Lynelle DoctorKnapp of disposition and need for outside placement.   Cyndie MullAnna Bebe Moncure, [facility] Triage Specialist

## 2014-11-17 NOTE — Progress Notes (Signed)
Per Morrie SheldonAshley at [facility], pt declined due to hx of seizures.  Ray at [facility] called stating referral is being reviewed for admission, asked if pt could secure transportation home upon discharge and if pt would be able to bring supply of his psychotropic medications. CSW called [facility] and RN spoke with pt, who affirms both of the above. CSW returned call to [facility], and [facility] is unavailable at this time. Left message with staff member Cedric for Ray to return call at [phone] or [phone] when available to discuss pt's disposition.   Ilean SkillMeghan Frutoso Dimare, MSW, LCSWA Clinical Social Work, Disposition  11/17/2014 [phone]

## 2014-11-18 NOTE — ED Notes (Signed)
[facility] Dr[facility] Shawnie DapperLopez, call report to [phone].

## 2014-11-18 NOTE — Progress Notes (Signed)
Per Marena [facility]Bianca at [facility], pt is accepted for admission by Dr. Shawnie DapperLopez, report [phone]. Can be transported when ready. Admission is voluntary. Spoke with [facility] charge RN re: pt's placement.  Ilean SkillMeghan Ibn Stief, MSW, LCSWA Clinical Social Work, Disposition  11/18/2014 [phone]

## 2014-11-18 NOTE — ED Provider Notes (Signed)
TTS reports patient may be able to go to [facility] in the morning.  They request Doc to doc referral at the number listed.  "per care coordinator Ryan Fernandez, states that [facility] attending [facility]. to [facility] to doc [facility] (due to patient having history of seizures and if patient compliant with medication) Shift Supervisor - [phone] fax:[phone]"  Ryan OctaveStephen Malakye Nolden, MD 11/18/14 0013

## 2014-12-19 ENCOUNTER — Emergency Department (HOSPITAL_COMMUNITY)
Admission: EM | Admit: 2014-12-19 | Discharge: 2014-12-20 | Disposition: A | Discharge status: Home / Self Care | Payer: 59 | Attending: Emergency Medicine | Admitting: Emergency Medicine

## 2014-12-19 ENCOUNTER — Encounter (HOSPITAL_COMMUNITY): Payer: Self-pay | Admitting: Emergency Medicine

## 2014-12-19 ENCOUNTER — Emergency Department (HOSPITAL_COMMUNITY): Payer: 59

## 2014-12-19 DIAGNOSIS — S93401A Sprain of unspecified ligament of right ankle, initial encounter: Secondary | ICD-10-CM | POA: Diagnosis not present

## 2014-12-19 DIAGNOSIS — S93421A Sprain of deltoid ligament of right ankle, initial encounter: Secondary | ICD-10-CM

## 2014-12-19 DIAGNOSIS — Z880 Allergy status to penicillin: Secondary | ICD-10-CM | POA: Diagnosis not present

## 2014-12-19 DIAGNOSIS — Z720 Tobacco use: Secondary | ICD-10-CM | POA: Insufficient documentation

## 2014-12-19 DIAGNOSIS — Y9389 Activity, other specified: Secondary | ICD-10-CM | POA: Diagnosis not present

## 2014-12-19 DIAGNOSIS — S99911A Unspecified injury of right ankle, initial encounter: Secondary | ICD-10-CM | POA: Diagnosis present

## 2014-12-19 DIAGNOSIS — Y92009 Unspecified place in unspecified non-institutional (private) residence as the place of occurrence of the external cause: Secondary | ICD-10-CM | POA: Insufficient documentation

## 2014-12-19 DIAGNOSIS — Z79899 Other long term (current) drug therapy: Secondary | ICD-10-CM | POA: Insufficient documentation

## 2014-12-19 DIAGNOSIS — G40909 Epilepsy, unspecified, not intractable, without status epilepticus: Secondary | ICD-10-CM | POA: Diagnosis not present

## 2014-12-19 DIAGNOSIS — W1849XA Other slipping, tripping and stumbling without falling, initial encounter: Secondary | ICD-10-CM | POA: Insufficient documentation

## 2014-12-19 DIAGNOSIS — Y998 Other external cause status: Secondary | ICD-10-CM | POA: Diagnosis not present

## 2014-12-19 MED ORDER — OXYCODONE-ACETAMINOPHEN 5-325 MG PO TABS
1.0000 | ORAL_TABLET | Freq: Once | ORAL | Status: AC
  Administered 2014-12-19: 1 via ORAL
  Filled 2014-12-19: qty 1

## 2014-12-19 MED ORDER — IBUPROFEN 800 MG PO TABS
800.0000 mg | ORAL_TABLET | Freq: Once | ORAL | Status: AC
  Administered 2014-12-19: 800 mg via ORAL
  Filled 2014-12-19: qty 1

## 2014-12-19 NOTE — ED Provider Notes (Signed)
CSN: [id number]     Arrival date & time 12/19/14  2326 History   First MD Initiated Contact with Patient 12/19/14 2340     Chief Complaint  Patient presents with  . Ankle Pain     (Consider location/radiation/quality/duration/timing/severity/associated sxs/prior Treatment) HPI   Ryan Fernandez is a 29 y.o. male who presents to the Emergency Department complaining of sudden onset of right ankle pain and swelling.  States he tripped going down some steps at his home and landed on his right ankle.  Incident occurred approximately one hr ago.  He reports immediate swelling to the outside of his ankle and worsening pain with any movement.  He has not tried any therapies prior to ed arrival.  He denies pain or swelling proximal to the ankle, open wounds, numbness or weakness of the extremity.  No hx of previous ankle fx's.     Past Medical History  Diagnosis Date  . Seizures    Past Surgical History  Procedure Laterality Date  . Middle ear surgery     History reviewed. No pertinent family history. Social History  Substance Use Topics  . Smoking status: Current Every Day Smoker -- 1.00 packs/day    Types: Cigarettes  . Smokeless tobacco: None  . Alcohol Use: Yes     Comment: 1 beer/day    Review of Systems  Constitutional: Negative for fever and chills.  Gastrointestinal: Negative for nausea and vomiting.  Musculoskeletal: Positive for joint swelling and arthralgias (right ankle pain and swelling).  Skin: Negative for color change and wound.  All other systems reviewed and are negative.     Allergies  Penicillins  Home Medications   Prior to Admission medications   Medication Sig Start Date End Date Taking? Authorizing Provider  phenytoin (DILANTIN) 100 MG ER capsule Take 3 capsules (300 mg total) by mouth at bedtime. Today take four capsules when you fill the prescription and four capsules three hours later.  Tomorrow start taking 3 capsules (300mg ) by mouth every night.  09/10/14  Yes Tilden Fossa, MD  traZODone (DESYREL) 50 MG tablet Take 50 mg by mouth at bedtime as needed for sleep.  08/15/14  Yes Historical Provider, MD  Buprenorphine HCl-Naloxone HCl 8-2 MG FILM Place 1 Film under the tongue daily.    Historical Provider, MD  cloNIDine (CATAPRES) 0.1 MG tablet Take 0.1 mg by mouth 2 (two) times daily. 11/06/14   Historical Provider, MD  escitalopram (LEXAPRO) 20 MG tablet Take 20 mg by mouth daily. 08/15/14   Historical Provider, MD  ibuprofen (ADVIL,MOTRIN) 200 MG tablet Take 400-800 mg by mouth every 6 (six) hours as needed for moderate pain.    Historical Provider, MD  naproxen sodium (ANAPROX) 220 MG tablet Take 440 mg by mouth daily as needed (pain).    Historical Provider, MD  ZUBSOLV 2.9-0.71 MG SUBL USE 1 TABLET SUBLINGUALLY ONCE A DAY 11/06/14   Historical Provider, MD   BP 139/89 mmHg  Pulse 94  Temp(Src) 97.6 F (36.4 C) (Oral)  Resp 22  Ht 5' 10.5" (1.791 m)  Wt 175 lb (79.379 kg)  BMI 24.75 kg/m2  SpO2 99% Physical Exam  Constitutional: He is oriented to person, place, and time. He appears well-developed and well-nourished. No distress.  HENT:  Head: Normocephalic and atraumatic.  Cardiovascular: Normal rate, regular rhythm, normal heart sounds and intact distal pulses.   Pulmonary/Chest: Effort normal and breath sounds normal. No respiratory distress.  Musculoskeletal: He exhibits edema and tenderness.  ttp of  the lateral right ankle, significant edema.  DP pulse is brisk,distal sensation intact.  No erythema, abrasion, bruising or bony deformity. Compartments soft.   No proximal tenderness.  Neurological: He is alert and oriented to person, place, and time. He exhibits normal muscle tone. Coordination normal.  Skin: Skin is warm and dry.  Nursing note and vitals reviewed.   ED Course  Procedures (including critical care time) Labs Review Labs Reviewed - No data to display  Imaging Review Dg Ankle Complete Right  12/20/2014    CLINICAL DATA:  Lateral right ankle pain, after tripping over loose board on porch and falling. Initial encounter.  EXAM: RIGHT ANKLE - COMPLETE 3+ VIEW  COMPARISON:  Right foot radiographs performed 07/11/2010  FINDINGS: There is no evidence of fracture or dislocation. The ankle mortise is intact; the interosseous space is within normal limits. No talar tilt or subluxation is seen.  The joint spaces are preserved. Lateral soft tissue swelling is noted.  IMPRESSION: No evidence of fracture or dislocation.   Electronically Signed   By: Roanna Raider M.D.   On: 12/20/2014 00:34   I, Malory Spurr L., personally reviewed and evaluated these images as part of my medical decision-making.   EKG Interpretation None      MDM   Final diagnoses:  Sprain, ankle joint, medial, right, initial encounter    ASO applied, crutches given.  Pain improved, remains NV intact.  Pt agrees to RICE therapy and ortho f/u in one week if not improved.  Appears stable for d/c and agrees to plan.      Pauline Aus, PA-C 12/20/14 [id number]  Derwood Kaplan, MD 12/21/14 2155

## 2014-12-19 NOTE — ED Notes (Signed)
Pt tripped and injured ankle, significant swelling immediately with pain

## 2014-12-20 MED ORDER — HYDROCODONE-ACETAMINOPHEN 5-325 MG PO TABS: ORAL_TABLET | ORAL | Status: DC

## 2014-12-20 MED ORDER — IBUPROFEN 800 MG PO TABS: 800.0000 mg | ORAL_TABLET | Freq: Three times a day (TID) | ORAL | Status: DC

## 2014-12-20 NOTE — ED Notes (Signed)
Discharge instructions given, pt demonstrated teach back and verbal understanding. No concerns voiced.  

## 2014-12-20 NOTE — Discharge Instructions (Signed)
Ankle Sprain  An ankle sprain is an injury to the strong, fibrous tissues (ligaments) that hold your ankle bones together.   HOME CARE   · Put ice on your ankle for 1-2 days or as told by your doctor.  ¨ Put ice in a plastic bag.  ¨ Place a towel between your skin and the bag.  ¨ Leave the ice on for 15-20 minutes at a time, every 2 hours while you are awake.  · Only take medicine as told by your doctor.  · Raise (elevate) your injured ankle above the level of your heart as much as possible for 2-3 days.  · Use crutches if your doctor tells you to. Slowly put your own weight on the affected ankle. Use the crutches until you can walk without pain.  · If you have a plaster splint:  ¨ Do not rest it on anything harder than a pillow for 24 hours.  ¨ Do not put weight on it.  ¨ Do not get it wet.  ¨ Take it off to shower or bathe.  · If given, use an elastic wrap or support stocking for support. Take the wrap off if your toes lose feeling (numb), tingle, or turn cold or blue.  · If you have an air splint:  ¨ Add or let out air to make it comfortable.  ¨ Take it off at night and to shower and bathe.  ¨ Wiggle your toes and move your ankle up and down often while you are wearing it.  GET HELP IF:  · You have rapidly increasing bruising or puffiness (swelling).  · Your toes feel very cold.  · You lose feeling in your foot.  · Your medicine does not help your pain.  GET HELP RIGHT AWAY IF:   · Your toes lose feeling (numb) or turn blue.  · You have severe pain that is increasing.  MAKE SURE YOU:   · Understand these instructions.  · Will watch your condition.  · Will get help right away if you are not doing well or get worse.  Document Released: 10/12/2007 Document Revised: 09/09/2013 Document Reviewed: 11/07/2011  ExitCare® Patient Information ©2015 [employer]. This information is not intended to replace advice given to you by your health care provider. Make sure you discuss any questions you have with your health care  provider.

## 2015-01-07 MED FILL — Hydrocodone-Acetaminophen Tab 5-325 MG: ORAL | Qty: 6 | Status: AC

## 2015-03-12 ENCOUNTER — Encounter (HOSPITAL_BASED_OUTPATIENT_CLINIC_OR_DEPARTMENT_OTHER): Payer: Self-pay | Admitting: Emergency Medicine

## 2015-03-12 ENCOUNTER — Emergency Department (HOSPITAL_BASED_OUTPATIENT_CLINIC_OR_DEPARTMENT_OTHER)
Admission: EM | Admit: 2015-03-12 | Discharge: 2015-03-12 | Disposition: A | Discharge status: Home / Self Care | Payer: 59 | Attending: Emergency Medicine | Admitting: Emergency Medicine

## 2015-03-12 DIAGNOSIS — Y990 Civilian activity done for income or pay: Secondary | ICD-10-CM | POA: Diagnosis not present

## 2015-03-12 DIAGNOSIS — Z79899 Other long term (current) drug therapy: Secondary | ICD-10-CM | POA: Insufficient documentation

## 2015-03-12 DIAGNOSIS — R569 Unspecified convulsions: Secondary | ICD-10-CM

## 2015-03-12 DIAGNOSIS — F1310 Sedative, hypnotic or anxiolytic abuse, uncomplicated: Secondary | ICD-10-CM | POA: Insufficient documentation

## 2015-03-12 DIAGNOSIS — Z720 Tobacco use: Secondary | ICD-10-CM | POA: Insufficient documentation

## 2015-03-12 DIAGNOSIS — Y9389 Activity, other specified: Secondary | ICD-10-CM | POA: Diagnosis not present

## 2015-03-12 DIAGNOSIS — Y9289 Other specified places as the place of occurrence of the external cause: Secondary | ICD-10-CM | POA: Insufficient documentation

## 2015-03-12 DIAGNOSIS — S0993XA Unspecified injury of face, initial encounter: Secondary | ICD-10-CM | POA: Diagnosis not present

## 2015-03-12 DIAGNOSIS — G40909 Epilepsy, unspecified, not intractable, without status epilepticus: Secondary | ICD-10-CM | POA: Insufficient documentation

## 2015-03-12 DIAGNOSIS — Z23 Encounter for immunization: Secondary | ICD-10-CM | POA: Diagnosis not present

## 2015-03-12 DIAGNOSIS — S0181XA Laceration without foreign body of other part of head, initial encounter: Secondary | ICD-10-CM | POA: Diagnosis not present

## 2015-03-12 DIAGNOSIS — F1210 Cannabis abuse, uncomplicated: Secondary | ICD-10-CM | POA: Diagnosis not present

## 2015-03-12 DIAGNOSIS — W01198A Fall on same level from slipping, tripping and stumbling with subsequent striking against other object, initial encounter: Secondary | ICD-10-CM | POA: Diagnosis not present

## 2015-03-12 DIAGNOSIS — Z880 Allergy status to penicillin: Secondary | ICD-10-CM | POA: Diagnosis not present

## 2015-03-12 DIAGNOSIS — F1110 Opioid abuse, uncomplicated: Secondary | ICD-10-CM | POA: Insufficient documentation

## 2015-03-12 LAB — CBC WITH DIFFERENTIAL/PLATELET
BASOS ABS: 0 10*3/uL (ref 0.0–0.1)
Basophils Relative: 0 %
EOS PCT: 0 %
Eosinophils Absolute: 0 10*3/uL (ref 0.0–0.7)
HCT: 42.7 % (ref 39.0–52.0)
HEMOGLOBIN: 14.9 g/dL (ref 13.0–17.0)
LYMPHS PCT: 9 %
Lymphs Abs: 0.9 10*3/uL (ref 0.7–4.0)
MCH: 31 pg (ref 26.0–34.0)
MCHC: 34.9 g/dL (ref 30.0–36.0)
MCV: 88.8 fL (ref 78.0–100.0)
Monocytes Absolute: 0.9 10*3/uL (ref 0.1–1.0)
Monocytes Relative: 9 %
NEUTROS PCT: 82 %
Neutro Abs: 8.5 10*3/uL — ABNORMAL HIGH (ref 1.7–7.7)
Platelets: 267 10*3/uL (ref 150–400)
RBC: 4.81 MIL/uL (ref 4.22–5.81)
RDW: 12.3 % (ref 11.5–15.5)
WBC: 10.4 10*3/uL (ref 4.0–10.5)

## 2015-03-12 LAB — BASIC METABOLIC PANEL
Anion gap: 6 (ref 5–15)
BUN: 13 mg/dL (ref 6–20)
CHLORIDE: 102 mmol/L (ref 101–111)
CO2: 27 mmol/L (ref 22–32)
CREATININE: 1.06 mg/dL (ref 0.61–1.24)
Calcium: 8.8 mg/dL — ABNORMAL LOW (ref 8.9–10.3)
Glucose, Bld: 106 mg/dL — ABNORMAL HIGH (ref 65–99)
Potassium: 3.9 mmol/L (ref 3.5–5.1)
SODIUM: 135 mmol/L (ref 135–145)

## 2015-03-12 LAB — URINE MICROSCOPIC-ADD ON

## 2015-03-12 LAB — URINALYSIS, ROUTINE W REFLEX MICROSCOPIC
Glucose, UA: NEGATIVE mg/dL
Hgb urine dipstick: NEGATIVE
KETONES UR: 15 mg/dL — AB
LEUKOCYTES UA: NEGATIVE
NITRITE: NEGATIVE
PROTEIN: 30 mg/dL — AB
Specific Gravity, Urine: 1.023 (ref 1.005–1.030)
UROBILINOGEN UA: 1 mg/dL (ref 0.0–1.0)
pH: 6 (ref 5.0–8.0)

## 2015-03-12 LAB — RAPID URINE DRUG SCREEN, HOSP PERFORMED
Amphetamines: NOT DETECTED
Barbiturates: NOT DETECTED
Benzodiazepines: POSITIVE — AB
COCAINE: NOT DETECTED
OPIATES: POSITIVE — AB
Tetrahydrocannabinol: POSITIVE — AB

## 2015-03-12 LAB — PHENYTOIN LEVEL, TOTAL

## 2015-03-12 MED ORDER — LIDOCAINE-EPINEPHRINE (PF) 2 %-1:200000 IJ SOLN
10.0000 mL | Freq: Once | INTRAMUSCULAR | Status: AC
  Administered 2015-03-12: 10 mL via INTRADERMAL
  Filled 2015-03-12: qty 10

## 2015-03-12 MED ORDER — LORAZEPAM 2 MG/ML IJ SOLN
1.0000 mg | Freq: Once | INTRAMUSCULAR | Status: AC
  Administered 2015-03-12: 1 mg via INTRAVENOUS
  Filled 2015-03-12: qty 1

## 2015-03-12 MED ORDER — SODIUM CHLORIDE 0.9 % IV BOLUS (SEPSIS)
1000.0000 mL | Freq: Once | INTRAVENOUS | Status: AC
  Administered 2015-03-12: 1000 mL via INTRAVENOUS

## 2015-03-12 MED ORDER — SODIUM CHLORIDE 0.9 % IV SOLN
1000.0000 mg | Freq: Once | INTRAVENOUS | Status: AC
  Administered 2015-03-12: 1000 mg via INTRAVENOUS
  Filled 2015-03-12: qty 20

## 2015-03-12 MED ORDER — TETANUS-DIPHTH-ACELL PERTUSSIS 5-2.5-18.5 LF-MCG/0.5 IM SUSP
0.5000 mL | Freq: Once | INTRAMUSCULAR | Status: AC
  Administered 2015-03-12: 0.5 mL via INTRAMUSCULAR
  Filled 2015-03-12: qty 0.5

## 2015-03-12 MED ORDER — PHENYTOIN SODIUM EXTENDED 100 MG PO CAPS: 300.0000 mg | ORAL_CAPSULE | Freq: Every day | ORAL | Status: DC

## 2015-03-12 NOTE — ED Notes (Signed)
Much teaching and encouragement done with Ryan Fernandez. On seeing his Dr. Melynda Kellerr Neurologist for his seizures and meds.  Ryan Fernandez. Very antsy about leaving and wanting to expedite his discharge for a cigarette.

## 2015-03-12 NOTE — ED Notes (Signed)
Called [facility] about med order.  Will have Dilantin sent over due to not having amt. Needed in out pyxis.

## 2015-03-12 NOTE — Discharge Instructions (Signed)
Resume taking your Dilantin as previously prescribed.  Local wound care with bacitracin and dressing changes twice daily.  Sutures are to be removed in 5 days, return sooner if you develop redness, pus, or other new and concerning symptoms.  Please follow-up with your neurologist in the next several days to discuss your situation. You should not drive, operate machinery, or swim until you are cleared by your neurologist.   Epilepsy Epilepsy is a disorder in which a person has repeated seizures over time. A seizure is a release of abnormal electrical activity in the brain. Seizures can cause a change in attention, behavior, or the ability to remain awake and alert (altered mental status). Seizures often involve uncontrollable shaking (convulsions).  Most people with epilepsy lead normal lives. However, people with epilepsy are at an increased risk of falls, accidents, and injuries. Therefore, it is important to begin treatment right away. CAUSES  Epilepsy has many possible causes. Anything that disturbs the normal pattern of brain cell activity can lead to seizures. This may include:   Head injury.  Birth trauma.  High fever as a child.  Stroke.  Bleeding into or around the brain.  Certain drugs.  Prolonged low oxygen, such as what occurs after CPR efforts.  Abnormal brain development.  Certain illnesses, such as meningitis, encephalitis (brain infection), malaria, and other infections.  An imbalance of nerve signaling chemicals (neurotransmitters).  SIGNS AND SYMPTOMS  The symptoms of a seizure can vary greatly from one person to another. Right before a seizure, you may have a warning (aura) that a seizure is about to occur. An aura may include the following symptoms:  Fear or anxiety.  Nausea.  Feeling like the room is spinning (vertigo).  Vision changes, such as seeing flashing lights or spots. Common symptoms during a seizure include:  Abnormal sensations, such as an  abnormal smell or a bitter taste in the mouth.   Sudden, general body stiffness.   Convulsions that involve rhythmic jerking of the face, arm, or leg on one or both sides.   Sudden change in consciousness.   Appearing to be awake but not responding.   Appearing to be asleep but cannot be awakened.   Grimacing, chewing, lip smacking, drooling, tongue biting, or loss of bowel or bladder control. After a seizure, you may feel sleepy for a while. DIAGNOSIS  Your health care provider will ask about your symptoms and take a medical history. Descriptions from any witnesses to your seizures will be very helpful in the diagnosis. A physical exam, including a detailed neurological exam, is necessary. Various tests may be done, such as:   An electroencephalogram (EEG). This is a painless test of your brain waves. In this test, a diagram is created of your brain waves. These diagrams can be interpreted by a specialist.  An MRI of the brain.   A CT scan of the brain.   A spinal tap (lumbar puncture, LP).  Blood tests to check for signs of infection or abnormal blood chemistry. TREATMENT  There is no cure for epilepsy, but it is generally treatable. Once epilepsy is diagnosed, it is important to begin treatment as soon as possible. For most people with epilepsy, seizures can be controlled with medicines. The following may also be used:  A pacemaker for the brain (vagus nerve stimulator) can be used for people with seizures that are not well controlled by medicine.  Surgery on the brain. For some people, epilepsy eventually goes away. HOME CARE INSTRUCTIONS  Follow your health care provider's recommendations on driving and safety in normal activities.  Get enough rest. Lack of sleep can cause seizures.  Only take over-the-counter or prescription medicines as directed by your health care provider. Take any prescribed medicine exactly as directed.  Avoid any known triggers of your  seizures.  Keep a seizure diary. Record what you recall about any seizure, especially any possible trigger.   Make sure the people you live and work with know that you are prone to seizures. They should receive instructions on how to help you. In general, a witness to a seizure should:   Cushion your head and body.   Turn you on your side.   Avoid unnecessarily restraining you.   Not place anything inside your mouth.   Call for emergency medical help if there is any question about what has occurred.   Follow up with your health care provider as directed. You may need regular blood tests to monitor the levels of your medicine.  SEEK MEDICAL CARE IF:   You develop signs of infection or other illness. This might increase the risk of a seizure.   You seem to be having more frequent seizures.   Your seizure pattern is changing.  SEEK IMMEDIATE MEDICAL CARE IF:   You have a seizure that does not stop after a few moments.   You have a seizure that causes any difficulty in breathing.   You have a seizure that results in a very severe headache.   You have a seizure that leaves you with the inability to speak or use a part of your body.    This information is not intended to replace advice given to you by your health care provider. Make sure you discuss any questions you have with your health care provider.   Document Released: 04/25/2005 Document Revised: 02/13/2013 Document Reviewed: 12/05/2012 Elsevier Interactive Patient Education 2016 Elsevier Inc.  Facial Laceration  A facial laceration is a cut on the face. These injuries can be painful and cause bleeding. Lacerations usually heal quickly, but they need special care to reduce scarring. DIAGNOSIS  Your health care provider will take a medical history, ask for details about how the injury occurred, and examine the wound to determine how deep the cut is. TREATMENT  Some facial lacerations may not require closure.  Others may not be able to be closed because of an increased risk of infection. The risk of infection and the chance for successful closure will depend on various factors, including the amount of time since the injury occurred. The wound may be cleaned to help prevent infection. If closure is appropriate, pain medicines may be given if needed. Your health care provider will use stitches (sutures), wound glue (adhesive), or skin adhesive strips to repair the laceration. These tools bring the skin edges together to allow for faster healing and a better cosmetic outcome. If needed, you may also be given a tetanus shot. HOME CARE INSTRUCTIONS  Only take over-the-counter or prescription medicines as directed by your health care provider.  Follow your health care provider's instructions for wound care. These instructions will vary depending on the technique used for closing the wound. For Sutures:  Keep the wound clean and dry.   If you were given a bandage (dressing), you should change it at least once a day. Also change the dressing if it becomes wet or dirty, or as directed by your health care provider.   Wash the wound with soap and water  2 times a day. Rinse the wound off with water to remove all soap. Pat the wound dry with a clean towel.   After cleaning, apply a thin layer of the antibiotic ointment recommended by your health care provider. This will help prevent infection and keep the dressing from sticking.   You may shower as usual after the first 24 hours. Do not soak the wound in water until the sutures are removed.   Get your sutures removed as directed by your health care provider. With facial lacerations, sutures should usually be taken out after 4-5 days to avoid stitch marks.   Wait a few days after your sutures are removed before applying any makeup. For Skin Adhesive Strips:  Keep the wound clean and dry.   Do not get the skin adhesive strips wet. You may bathe  carefully, using caution to keep the wound dry.   If the wound gets wet, pat it dry with a clean towel.   Skin adhesive strips will fall off on their own. You may trim the strips as the wound heals. Do not remove skin adhesive strips that are still stuck to the wound. They will fall off in time.  For Wound Adhesive:  You may briefly wet your wound in the shower or bath. Do not soak or scrub the wound. Do not swim. Avoid periods of heavy sweating until the skin adhesive has fallen off on its own. After showering or bathing, gently pat the wound dry with a clean towel.   Do not apply liquid medicine, cream medicine, ointment medicine, or makeup to your wound while the skin adhesive is in place. This may loosen the film before your wound is healed.   If a dressing is placed over the wound, be careful not to apply tape directly over the skin adhesive. This may cause the adhesive to be pulled off before the wound is healed.   Avoid prolonged exposure to sunlight or tanning lamps while the skin adhesive is in place.  The skin adhesive will usually remain in place for 5-10 days, then naturally fall off the skin. Do not pick at the adhesive film.  After Healing: Once the wound has healed, cover the wound with sunscreen during the day for 1 full year. This can help minimize scarring. Exposure to ultraviolet light in the first year will darken the scar. It can take 1-2 years for the scar to lose its redness and to heal completely.  SEEK MEDICAL CARE IF:  You have a fever. SEEK IMMEDIATE MEDICAL CARE IF:  You have redness, pain, or swelling around the wound.   You see ayellowish-white fluid (pus) coming from the wound.    This information is not intended to replace advice given to you by your health care provider. Make sure you discuss any questions you have with your health care provider.   Document Released: 06/02/2004 Document Revised: 05/16/2014 Document Reviewed: 12/06/2012 Elsevier  Interactive Patient Education [employer] Else[employer]

## 2015-03-12 NOTE — ED Notes (Signed)
Pt. Has been rounded on by EMT Madaline GuthrieFernando and EMT Natalia LeatherwoodKatherine.  Pt. Aware of action plan and EDP to discharge the Pt.

## 2015-03-12 NOTE — ED Notes (Signed)
Per EMS:  Pt at work.  Pt felt bad.  Per co-worker, pt fell backwards, had trembling movements with blank stare.  Pt came around easily.  Pt has cut to left ear, chin and bruise to left side of tongue.  Pt states he feels bad all over.  Pt has cough.

## 2015-03-12 NOTE — ED Provider Notes (Signed)
CSN: [id number]     Arrival date & time 03/12/15  1436 History   First MD Initiated Contact with Patient 03/12/15 1540     Chief Complaint  Patient presents with  . Seizures     (Consider location/radiation/quality/duration/timing/severity/associated sxs/prior Treatment) HPI Comments: Patient is a 29 year old male with history of seizure disorder. He presents for evaluation of an apparent seizure. He was at work [employer] when he suddenly began shaking all over and became unresponsive. He struck his chin and bit his tongue. This lasted several minutes, then resolved. He was confused immediately afterward and was brought here by EMS. He has had no further seizure activity. He is supposed to be taking Dilantin, however he has been out of this for the past 2 weeks and has been unable to pick with his doctor about refilling this.  His neurologist is Dr. Gerilyn Pilgrim.  Patient is a 29 y.o. male presenting with seizures. The history is provided by the patient.  Seizures Seizure activity on arrival: no   Seizure type:  Grand mal Preceding symptoms: aura   Initial focality:  None Episode characteristics: generalized shaking   Postictal symptoms: confusion   Return to baseline: yes   Severity:  Moderate Duration:  2 minutes Timing:  Once Progression:  Resolved Context comment:  Out of medication Recent head injury:  No recent head injuries PTA treatment:  None History of seizures: yes     Past Medical History  Diagnosis Date  . Seizures [facility])    Past Surgical History  Procedure Laterality Date  . Middle ear surgery     History reviewed. No pertinent family history. Social History  Substance Use Topics  . Smoking status: Current Every Day Smoker -- 1.00 packs/day    Types: Cigarettes  . Smokeless tobacco: None  . Alcohol Use: Yes     Comment: 1 beer/day    Review of Systems  Neurological: Positive for seizures.  All other systems reviewed and are  negative.     Allergies  Penicillins  Home Medications   Prior to Admission medications   Medication Sig Start Date End Date Taking? Authorizing Provider  buprenorphine-naloxone (SUBOXONE) 8-2 MG SUBL SL tablet Place 1 tablet under the tongue daily.   Yes Historical Provider, MD  Buprenorphine HCl-Naloxone HCl 8-2 MG FILM Place 1 Film under the tongue daily.    Historical Provider, MD  cloNIDine (CATAPRES) 0.1 MG tablet Take 0.1 mg by mouth 2 (two) times daily. 11/06/14   Historical Provider, MD  escitalopram (LEXAPRO) 20 MG tablet Take 20 mg by mouth daily. 08/15/14   Historical Provider, MD  HYDROcodone-acetaminophen (NORCO/VICODIN) 5-325 MG per tablet Take one-two tabs po q 4-6 hrs prn pain 12/20/14   Tammy Triplett, PA-C  HYDROcodone-acetaminophen (NORCO/VICODIN) 5-325 MG per tablet Take one-two tabs po q 4-6 hrs prn pain 12/20/14   Tammy Triplett, PA-C  ibuprofen (ADVIL,MOTRIN) 800 MG tablet Take 1 tablet (800 mg total) by mouth 3 (three) times daily. With food 12/20/14   Tammy Triplett, PA-C  naproxen sodium (ANAPROX) 220 MG tablet Take 440 mg by mouth daily as needed (pain).    Historical Provider, MD  phenytoin (DILANTIN) 100 MG ER capsule Take 3 capsules (300 mg total) by mouth at bedtime. Today take four capsules when you fill the prescription and four capsules three hours later.  Tomorrow start taking 3 capsules ( ) by mouth every night. 09/10/14   Tilden Fossa, MD  traZODone (DESYREL) 50 MG tablet Take 50 mg by mouth  at bedtime as needed for sleep.  08/15/14   Historical Provider, MD  ZUBSOLV 2.9-0.71 MG SUBL USE 1 TABLET SUBLINGUALLY ONCE A DAY 11/06/14   Historical Provider, MD   BP 124/76 mmHg  Pulse 82  Temp(Src) 98.1 F (36.7 C) (Oral)  Resp 18  Ht 5\' 10"  (1.778 m)  Wt 175 lb (79.379 kg)  BMI 25.11 kg/m2  SpO2 96% Physical Exam  Constitutional: He is oriented to person, place, and time. He appears well-developed and well-nourished. No distress.  HENT:  Head:  Normocephalic and atraumatic.  Mouth/Throat: Oropharynx is clear and moist.  There are bite marks to the left side of the tongue. He also has a 1.5 cm laceration to the chin.  Eyes: EOM are normal. Pupils are equal, round, and reactive to light.  Neck: Normal range of motion. Neck supple.  Cardiovascular: Normal rate, regular rhythm and normal heart sounds.   No murmur heard. Pulmonary/Chest: Effort normal and breath sounds normal. No respiratory distress. He has no wheezes.  Abdominal: Soft. Bowel sounds are normal. He exhibits no distension. There is no tenderness.  Musculoskeletal: Normal range of motion. He exhibits no edema.  Neurological: He is alert and oriented to person, place, and time. No cranial nerve deficit. He exhibits normal muscle tone. Coordination normal.  Skin: Skin is warm and dry. He is not diaphoretic.  Nursing note and vitals reviewed.   ED Course  Procedures (including critical care time) Labs Review Labs Reviewed  BASIC METABOLIC PANEL  CBC WITH DIFFERENTIAL/PLATELET  PHENYTOIN LEVEL, TOTAL    Imaging Review No results found. I have personally reviewed and evaluated these images and lab results as part of my medical decision-making.   EKG Interpretation None     LACERATION REPAIR Performed by: Geoffery LyonseLo, Maurizio Geno Authorized by: Geoffery LyonseLo, Larell Baney Consent: Verbal consent obtained. Risks and benefits: risks, benefits and alternatives were discussed Consent given by: patient Patient identity confirmed: provided demographic data Prepped and Draped in normal sterile fashion Wound explored  Laceration Location: Chin  Laceration Length: 1.5 cm  No Foreign Bodies seen or palpated  Anesthesia: local infiltration  Local anesthetic: lidocaine 2 % with epinephrine  Anesthetic total: 2 ml  Irrigation method: syringe Amount of cleaning: standard  Skin closure: 5-0 Prolene   Number of sutures: 3   Technique: Simple interrupted   Patient tolerance: Patient  tolerated the procedure well with no immediate complications.   MDM   Final diagnoses:  None    Patient presents here after a seizure. He has a history of seizure disorder and drug abuse, however is not been taking his Lantus for several weeks. Laceration to his chin was repaired. He was given IV Dilantin and will be discharged with a prescription for Dilantin. He is to follow-up with Dr. Gerilyn Pilgrimoonquah in the next week.    Geoffery Lyonsouglas Xochilth Standish, MD 03/12/15 1945

## 2015-03-12 NOTE — ED Notes (Signed)
Pt. Rt. Side face noted with mild edema and redness after a fall and seizure today.  Pt. Has c/o jaw pain on the R and L side.

## 2016-09-02 ENCOUNTER — Emergency Department (HOSPITAL_COMMUNITY)
Admission: EM | Admit: 2016-09-02 | Discharge: 2016-09-02 | Disposition: A | Discharge status: Home / Self Care | Payer: 59 | Attending: Emergency Medicine | Admitting: Emergency Medicine

## 2016-09-02 ENCOUNTER — Encounter (HOSPITAL_COMMUNITY): Payer: Self-pay

## 2016-09-02 DIAGNOSIS — F17210 Nicotine dependence, cigarettes, uncomplicated: Secondary | ICD-10-CM | POA: Diagnosis not present

## 2016-09-02 DIAGNOSIS — K047 Periapical abscess without sinus: Secondary | ICD-10-CM | POA: Diagnosis not present

## 2016-09-02 DIAGNOSIS — R569 Unspecified convulsions: Secondary | ICD-10-CM

## 2016-09-02 DIAGNOSIS — Z79899 Other long term (current) drug therapy: Secondary | ICD-10-CM | POA: Diagnosis not present

## 2016-09-02 DIAGNOSIS — G40909 Epilepsy, unspecified, not intractable, without status epilepticus: Secondary | ICD-10-CM | POA: Diagnosis not present

## 2016-09-02 LAB — BASIC METABOLIC PANEL
Anion gap: 9 (ref 5–15)
BUN: 13 mg/dL (ref 6–20)
CALCIUM: 9.2 mg/dL (ref 8.9–10.3)
CO2: 26 mmol/L (ref 22–32)
Chloride: 98 mmol/L — ABNORMAL LOW (ref 101–111)
Creatinine, Ser: 0.9 mg/dL (ref 0.61–1.24)
GFR calc Af Amer: >60 mL/min (ref >60)
GFR calc non Af Amer: >60 mL/min (ref >60)
GLUCOSE: 104 mg/dL — AB (ref 65–99)
Potassium: 3.8 mmol/L (ref 3.5–5.1)
Sodium: 133 mmol/L — ABNORMAL LOW (ref 135–145)

## 2016-09-02 LAB — CBC
HCT: 39.9 % (ref 39.0–52.0)
Hemoglobin: 13.5 g/dL (ref 13.0–17.0)
MCH: 30.3 pg (ref 26.0–34.0)
MCHC: 33.8 g/dL (ref 30.0–36.0)
MCV: 89.7 fL (ref 78.0–100.0)
PLATELETS: 224 10*3/uL (ref 150–400)
RBC: 4.45 MIL/uL (ref 4.22–5.81)
RDW: 13.1 % (ref 11.5–15.5)
WBC: 13.9 10*3/uL — ABNORMAL HIGH (ref 4.0–10.5)

## 2016-09-02 LAB — RAPID URINE DRUG SCREEN, HOSP PERFORMED
AMPHETAMINES: NOT DETECTED
Barbiturates: NOT DETECTED
Benzodiazepines: NOT DETECTED
Cocaine: NOT DETECTED
Opiates: NOT DETECTED
Tetrahydrocannabinol: NOT DETECTED

## 2016-09-02 LAB — ALBUMIN: ALBUMIN: 4.3 g/dL (ref 3.5–5.0)

## 2016-09-02 LAB — PHENYTOIN LEVEL, TOTAL: Phenytoin Lvl: <2.5 ug/mL — ABNORMAL LOW (ref 10.0–20.0)

## 2016-09-02 MED ORDER — KETOROLAC TROMETHAMINE 15 MG/ML IJ SOLN
15.0000 mg | Freq: Once | INTRAMUSCULAR | Status: AC
  Administered 2016-09-02: 15 mg via INTRAVENOUS
  Filled 2016-09-02: qty 1

## 2016-09-02 MED ORDER — SODIUM CHLORIDE 0.9 % IV BOLUS (SEPSIS)
1000.0000 mL | Freq: Once | INTRAVENOUS | Status: AC
  Administered 2016-09-02: 1000 mL via INTRAVENOUS

## 2016-09-02 MED ORDER — SODIUM CHLORIDE 0.9 % IV SOLN
1250.0000 mg | Freq: Once | INTRAVENOUS | Status: AC
  Administered 2016-09-02: 1250 mg via INTRAVENOUS
  Filled 2016-09-02: qty 25

## 2016-09-02 MED ORDER — CLINDAMYCIN HCL 300 MG PO CAPS: 300.0000 mg | ORAL_CAPSULE | Freq: Three times a day (TID) | ORAL | 0 refills | Status: DC

## 2016-09-02 NOTE — ED Provider Notes (Signed)
[facility]-EMERGENCY DEPT Provider Note   CSN: [id number] Arrival date & time: 09/02/16  1314     History   Chief Complaint Chief Complaint  Patient presents with  . Seizures    HPI Ryan Fernandez is a 31 y.o. male.  HPI 31 year old Caucasian male past medical history significant for seizures presents to the ED today with complaints of seizure while at work. Patient is currently by EMS after having an apparent seizure at work. States he fell back and hit the floor and had full body shaking. Patient also states that the left side of his tongue. EMS reports that when they arrived he was alert and diaphoretic. States is his third seizure in 8 years. States he takes his medications daily. He states he is on Dilantin and lamotrigine he also takes Valium. Patient also complains of an abscessed tooth that was treated 2 weeks ago on the right side of his mouth with clindamycin as patient allergic to penicillin. States that improved however now he complains of left-sided toothache with mild facial swelling. Denies any difficulty swallowing. Not taking anything for his pain dental pain. Does see a neurologist last saw them one month ago with normal workup. Patient denies any vision changes, headache. Patient denies any fever, chills, headache, vision changes, lightheadedness, dizziness, chest pain, shortness of breath, abdominal pain, nausea, emesis, urinary symptoms, change in bowel habits. Past Medical History:  Diagnosis Date  . Seizures [facility])     Patient Active Problem List   Diagnosis Date Noted  . TOBACCO ABUSE 08/20/2009  . HYPERTENSION, BORDERLINE 08/20/2009  . SEIZURE DISORDER 08/20/2009  . CHEST PAIN-UNSPECIFIED 08/20/2009  . SYNCOPE, HX OF 08/20/2009    Past Surgical History:  Procedure Laterality Date  . MIDDLE EAR SURGERY         Home Medications    Prior to Admission medications   Medication Sig Start Date End Date Taking? Authorizing Provider  diazepam (VALIUM) 10 MG  tablet Take 10 mg by mouth 3 (three) times daily.   Yes Historical Provider, MD  ibuprofen (ADVIL,MOTRIN) 200 MG tablet Take 800 mg by mouth every 6 (six) hours as needed (tooth pain).   Yes Historical Provider, MD  lamoTRIgine (LAMICTAL) 150 MG tablet Take 150 mg by mouth daily.   Yes Historical Provider, MD  phenytoin (DILANTIN) 100 MG ER capsule Take 3 capsules (300 mg total) by mouth at bedtime. 03/12/15  Yes Geoffery Lyons, MD  clindamycin (CLEOCIN) 300 MG capsule Take 1 capsule (300 mg total) by mouth 3 (three) times daily. 09/02/16   Rise Mu, PA-C    Family History History reviewed. No pertinent family history.  Social History Social History  Substance Use Topics  . Smoking status: Current Every Day Smoker    Packs/day: 1.00    Types: Cigarettes  . Smokeless tobacco: Never Used  . Alcohol use Yes     Comment: 1 beer/day     Allergies   Penicillins   Review of Systems Review of Systems  Constitutional: Negative for chills and fever.  HENT: Positive for dental problem.   Musculoskeletal: Negative for back pain, neck pain and neck stiffness.  Skin: Negative for wound.  Neurological: Positive for seizures. Negative for dizziness, syncope, weakness, light-headedness and headaches.     Physical Exam Updated Vital Signs BP 129/86 (BP Location: Right Arm)   Pulse 85   Temp 97.8 F (36.6 C) (Oral)   Resp 20   Ht  (1.778 m)   Wt 81.6 kg  SpO2 99%   BMI 25.83 kg/m   Physical Exam  Constitutional: He is oriented to person, place, and time. He appears well-developed and well-nourished. No distress.  HENT:  Head: Normocephalic and atraumatic.  Mouth/Throat: Oropharynx is clear and moist.    The patient has small hematoma to the back of the head without any open wounds. Mildly tender to palpation. No bilateral hemotympanum. No septal hematoma. Patient does have bruising to the left orbit he states that this is from an incident last week that has been  healing.  Patient does have poor dentition throughout with several missing teeth and dental caries. No gross abscess noted. No facial swelling appreciated. No sublingual or submandibular swelling. Oropharynx is clear. Patient managing secretions and maintaining airway.  Eyes: Conjunctivae and EOM are normal. Pupils are equal, round, and reactive to light. Right eye exhibits no discharge. Left eye exhibits no discharge. No scleral icterus.  Small subconjunctival hemorrhage of the left eye the patient states is resolving from incident last week. EOMs are normal.  Neck: Normal range of motion. Neck supple. No thyromegaly present.  No midline tenderness. No deformity or step-offs noted.  Cardiovascular: Normal rate, regular rhythm, normal heart sounds and intact distal pulses.   Pulmonary/Chest: Effort normal and breath sounds normal.  Abdominal: Soft. Bowel sounds are normal. He exhibits no distension. There is no tenderness. There is no rebound and no guarding.  Musculoskeletal: Normal range of motion.   Midline L-spine or T-spine tenderness. No deformities or step-offs noted. Full range of motion. Pelvis is stable. Moving all 4 extremities difficulty.  Lymphadenopathy:    He has no cervical adenopathy.  Neurological: He is alert and oriented to person, place, and time.  The patient is alert, attentive, and oriented x 3. Speech is clear. Cranial nerve II-VII grossly intact. Negative pronator drift. Sensation intact. Strength 5/5 in all extremities. Reflexes 2+ and symmetric at biceps, triceps, knees, and ankles. Rapid alternating movement and fine finger movements intact. Romberg is absent. Posture and gait normal.   Skin: Skin is warm and dry. Capillary refill takes less than 2 seconds.  Nursing note and vitals reviewed.    ED Treatments / Results  Labs (all labs ordered are listed, but only abnormal results are displayed) Labs Reviewed  BASIC METABOLIC PANEL - Abnormal; Notable for the  following:       Result Value   Sodium 133 (*)    Chloride 98 (*)    Glucose, Bld 104 (*)    All other components within normal limits  PHENYTOIN LEVEL, TOTAL - Abnormal; Notable for the following:    Phenytoin Lvl <2.5 (*)    All other components within normal limits  CBC - Abnormal; Notable for the following:    WBC 13.9 (*)    All other components within normal limits  RAPID URINE DRUG SCREEN, HOSP PERFORMED  ALBUMIN    EKG  EKG Interpretation None       Radiology No results found.  Procedures Procedures (including critical care time)  Medications Ordered in ED Medications  sodium chloride 0.9 % bolus 1,000 mL (0 mLs Intravenous Stopped 09/02/16 1705)  ketorolac (TORADOL) 15 MG/ML injection 15 mg (15 mg Intravenous Given 09/02/16 1555)  phenytoin (DILANTIN) 1,250 mg in sodium chloride 0.9 % 250 mL IVPB (0 mg Intravenous Stopped 09/02/16 1706)     Initial Impression / Assessment and Plan / ED Course  I have reviewed the triage vital signs and the nursing notes.  Pertinent labs &  imaging results that were available during my care of the patient were reviewed by me and considered in my medical decision making (see chart for details).     The patient has history of seizures and had a seizure at work today following hitting his head. Patient with small hematoma. No focal neuro deficit. Do not feel imaging is indicated based on [place] head CT rule. Patient with no evidence of focal neuro deficits on physical exam and is at mental baseline.  Labs and imaging have been reviewed.  Patient is advised to followup with neurologist in regards to today's event.  Spoke with patient and family in detail about driving restrictions until cleared by a neurologist.  Patient with mild elevation in white count of 13,000. Urine drug screen is normal. Dilantin level is less than 2.5. Patient states he is taking his medication as prescribed however I have suspicion that patient is not taking his  seizure medications. Does complain of left-sided dental pain with poor dentition throughout. Infection could make him more prone to seizures. Patient given loading dose of Dilantin. Will be started on antibiotics for tooth abscess and given dental referral. No signs of Ludwig angina or deep neck infection. Patient able to by mouth challenge difficulties. Has been here has not had apparent seizure while in the ED. He feels stable for discharge with follow-up with his neurologist next week. Patient verbalizes understanding.  Answered all questions.  Patient is hemodynamically stable and in no acute distress prior to discharge. Dicussed with Dr. Rubin Payor who is agreeable to the above plan. Encourage patient to continue taking his medication at home as prescribed.  Final Clinical Impressions(s) / ED Diagnoses   Final diagnoses:  Seizure (HCC)  Tooth abscess    New Prescriptions New Prescriptions   CLINDAMYCIN (CLEOCIN) 300 MG CAPSULE    Take 1 capsule (300 mg total) by mouth 3 (three) times daily.     Rise Mu, PA-C 09/02/16 1735    Benjiman Core, MD 09/04/16 [phone]

## 2016-09-02 NOTE — ED Triage Notes (Signed)
PT RECEIVED FROM WORK VIA EMS FOR A WITNESSED SEIZURE. PT WAS WORKING AT A [employer] AT THE TIME, HE FELL BACK AND HIT THE BACK OF HIS HEAD DURING THE SEIZURE. HE ALSO BIT THE LEFT SIDE OF HIS TONGUE. PER EMS, WHEN THEY ARRIVED HE WAS ALERT AND DIAPHORETIC. PT STS THIS IS HIS 3RD SEIZURE IN 8 YEARS, AND HE TAKES HIS MEDICATION DAILY. PT STS HE HAS ALSO HAD SOME TOOTH ABSCESSES AND PAIN.

## 2016-09-02 NOTE — ED Notes (Signed)
Bed: WA01 Expected date: 09/02/16 Expected time: 1:09 PM Means of arrival: Ambulance Comments: Seizure

## 2016-09-02 NOTE — ED Notes (Signed)
Pt ambulatory and independent at discharge.  Verbalized understanding of discharge instructions 

## 2016-09-02 NOTE — Discharge Instructions (Signed)
Please take your medications for your seizure as prescribed. Continue antibiotics. Tooth. Please follow-up with dentist. Motrin and Tylenol for pain. follow-up with your neurologist and primary care doctor next week. Do not drive until follow up with neurologist.

## 2016-09-02 NOTE — ED Notes (Signed)
Contacted pharmacy regarding Dilantin.  They will be sending it as soon as they get finished with the dosing.

## 2016-09-02 NOTE — Progress Notes (Signed)
MEDICATION RELATED CONSULT NOTE - INITIAL   Pharmacy Consult for Phenytoin Indication: seizure  Allergies  Allergen Reactions  . Penicillins Other (See Comments)    Childhood allergy.    Patient Measurements: Height:  (177.8 cm) Weight: 180 lb (81.6 kg) IBW/kg (Calculated) : 73   Vital Signs: Temp: 97.8 F (36.6 C) (04/27 1341) Temp Source: Oral (04/27 1341) BP: 140/92 (04/27 1545) Pulse Rate: 82 (04/27 1545) Intake/Output from previous day: No intake/output data recorded. Intake/Output from this shift: No intake/output data recorded.  Labs:  Recent Labs  09/02/16 1411  WBC 13.9*  HGB 13.5  HCT 39.9  PLT 224  CREATININE 0.90  ALBUMIN 4.3   Estimated Creatinine Clearance: 122.8 mL/min (by C-G formula based on SCr of 0.9 mg/dL).   Microbiology: No results found for this or any previous visit (from the past 720 hour(s)).  Medical History: Past Medical History:  Diagnosis Date  . Seizures (HCC)     Assessment: 19 y/oM with PMH of seizures on multiple anti-epileptics PTA who presents to [facility] ED on 09/02/16 for witnessed seizure at work. Phenytoin level obtained by EDP and returned undetectable (< 2.5 mcg/mL). Pharmacy consulted for IV loading dose of Phenytoin.   Goal of Therapy:  Level 15-20 mcg/mL  Plan:  Load with Phenytoin  IV x 1     Greer Pickerel, PharmD, BCPS Pager: [phone] 09/02/2016 3:59 PM

## 2016-09-28 ENCOUNTER — Encounter (HOSPITAL_COMMUNITY): Payer: Self-pay | Admitting: *Deleted

## 2016-09-28 ENCOUNTER — Emergency Department (HOSPITAL_COMMUNITY)
Admission: EM | Admit: 2016-09-28 | Discharge: 2016-09-28 | Disposition: A | Discharge status: Home / Self Care | Payer: 59 | Attending: Emergency Medicine | Admitting: Emergency Medicine

## 2016-09-28 DIAGNOSIS — F17210 Nicotine dependence, cigarettes, uncomplicated: Secondary | ICD-10-CM | POA: Insufficient documentation

## 2016-09-28 DIAGNOSIS — Z79899 Other long term (current) drug therapy: Secondary | ICD-10-CM | POA: Insufficient documentation

## 2016-09-28 DIAGNOSIS — F1010 Alcohol abuse, uncomplicated: Secondary | ICD-10-CM | POA: Diagnosis present

## 2016-09-28 MED ORDER — CHLORDIAZEPOXIDE HCL 25 MG PO CAPS: ORAL_CAPSULE | ORAL | 0 refills | Status: DC

## 2016-09-28 NOTE — ED Provider Notes (Signed)
[facility]-EMERGENCY DEPT Provider Note   CSN: [id number] Arrival date & time: 09/28/16  1220     History   Chief Complaint Chief Complaint  Patient presents with  . Detox    HPI Ryan Fernandez is a 31 y.o. male.  HPI Patient presents requesting detox. States that the court has arrange for some detox warm. States she went today [facility] told him that he could come back after a three-day detox. He does use Suboxone. Previously prescribed but then kicked out of the clinic. States he last used that on Thursday. States he used full tab that day. Also uses alcohol. Drinks about 6-10 beers a day. Last drink yesterday. States he has not had severe detox the past. He does however have history of seizure disorders. No suicidal homicidal thoughts. No hallucinations.   Past Medical History:  Diagnosis Date  . Seizures [facility](HCC)     Patient Active Problem List   Diagnosis Date Noted  . TOBACCO ABUSE 08/20/2009  . HYPERTENSION, BORDERLINE 08/20/2009  . SEIZURE DISORDER 08/20/2009  . CHEST PAIN-UNSPECIFIED 08/20/2009  . SYNCOPE, HX OF 08/20/2009    Past Surgical History:  Procedure Laterality Date  . MIDDLE EAR SURGERY         Home Medications    Prior to Admission medications   Medication Sig Start Date End Date Taking? Authorizing Provider  chlordiazePOXIDE (LIBRIUM) 25 MG capsule 50mg  PO TID x 1D, then 25-50mg  PO BID X 1D, then 25-50mg  PO QD X 1D 09/28/16   Benjiman CorePickering, Adrienne Trombetta, MD  clindamycin (CLEOCIN) 300 MG capsule Take 1 capsule (300 mg total) by mouth 3 (three) times daily. 09/02/16   Rise MuLeaphart, Kenneth T, PA-C  diazepam (VALIUM) 10 MG tablet Take 10 mg by mouth 3 (three) times daily.    [provider]  ibuprofen (ADVIL,MOTRIN) 200 MG tablet Take 800 mg by mouth every 6 (six) hours as needed (tooth pain).    [provider]  lamoTRIgine (LAMICTAL) 150 MG tablet Take 150 mg by mouth daily.    [provider]  phenytoin (DILANTIN) 100 MG ER capsule Take 3  capsules (300 mg total) by mouth at bedtime. 03/12/15   Geoffery Lyonselo, Douglas, MD    Family History No family history on file.  Social History Social History  Substance Use Topics  . Smoking status: Current Every Day Smoker    Packs/day: 1.00    Types: Cigarettes  . Smokeless tobacco: Never Used  . Alcohol use Yes     Allergies   Penicillins   Review of Systems Review of Systems  Constitutional: Positive for appetite change.  HENT: Negative for congestion.   Respiratory: Negative for chest tightness.   Cardiovascular: Negative for chest pain.  Gastrointestinal: Negative for abdominal distention.  Endocrine: Negative for polyuria.  Genitourinary: Negative for flank pain.  Musculoskeletal: Negative for back pain.  Neurological: Negative for seizures.  Hematological: Negative for adenopathy.  Psychiatric/Behavioral: Negative for confusion.     Physical Exam Updated Vital Signs BP (!) 147/100 (BP Location: Left Arm)   Pulse 88   Temp 98.3 F (36.8 C) (Temporal)   Resp 18   Ht 5\' 11"  (1.803 m)   Wt 79.4 kg (175 lb)   SpO2 98%   BMI 24.41 kg/m   Physical Exam  Constitutional: He appears well-developed.  HENT:  Head: Atraumatic.  Patient has face tattoos  Neck: Neck supple.  Cardiovascular: Normal rate.   Pulmonary/Chest: Effort normal.  Abdominal: Soft. There is no tenderness.  Musculoskeletal: Normal range  of motion.  Neurological: He is alert.  Skin: Skin is warm.     ED Treatments / Results  Labs (all labs ordered are listed, but only abnormal results are displayed) Labs Reviewed - No data to display  EKG  EKG Interpretation None       Radiology No results found.  Procedures Procedures (including critical care time)  Medications Ordered in ED Medications - No data to display   Initial Impression / Assessment and Plan / ED Course  I have reviewed the triage vital signs and the nursing notes.  Pertinent labs & imaging results that were  available during my care of the patient were reviewed by me and considered in my medical decision making (see chart for details).     Patient presented for "a three-day detox". States he was told that he needed this from [facility]. Last use opiates 6 days ago last alcohol yesterday. No history of severe withdrawals but has had seizures in the past. States he is taking Dilantin. This point he does not appear to need severe medical clearance. I think he is a low risk for severe withdrawal. Will give Librium taper and will follow-up either with a marker other resources given.  Final Clinical Impressions(s) / ED Diagnoses   Final diagnoses:  Alcohol abuse    New Prescriptions New Prescriptions   CHLORDIAZEPOXIDE (LIBRIUM) 25 MG CAPSULE    50mg  PO TID x 1D, then 25-50mg  PO BID X 1D, then 25-50mg  PO QD X 1D     Benjiman Core, MD 09/28/16 1308

## 2016-09-28 NOTE — ED Triage Notes (Signed)
Pt comes in for a "3 day detox" from opiates (last use was last thursday) and alcohol (last use last night). Pt is under probation and was told to come here by his officer. Denies any SI, HI, or AVH.

## 2017-01-26 ENCOUNTER — Encounter (HOSPITAL_COMMUNITY): Payer: Self-pay | Admitting: Emergency Medicine

## 2017-01-26 ENCOUNTER — Emergency Department (HOSPITAL_COMMUNITY)
Admission: EM | Admit: 2017-01-26 | Discharge: 2017-01-26 | Disposition: A | Discharge status: Home / Self Care | Payer: 59 | Attending: Emergency Medicine | Admitting: Emergency Medicine

## 2017-01-26 DIAGNOSIS — R2232 Localized swelling, mass and lump, left upper limb: Secondary | ICD-10-CM | POA: Diagnosis not present

## 2017-01-26 DIAGNOSIS — Z5321 Procedure and treatment not carried out due to patient leaving prior to being seen by health care provider: Secondary | ICD-10-CM | POA: Insufficient documentation

## 2017-01-26 DIAGNOSIS — M79642 Pain in left hand: Secondary | ICD-10-CM | POA: Diagnosis present

## 2017-01-26 LAB — CBC WITH DIFFERENTIAL/PLATELET
Basophils Absolute: 0 10*3/uL (ref 0.0–0.1)
Basophils Relative: 0 %
EOS ABS: 0.9 10*3/uL — AB (ref 0.0–0.7)
EOS PCT: 9 %
HCT: 39.8 % (ref 39.0–52.0)
Hemoglobin: 13.7 g/dL (ref 13.0–17.0)
LYMPHS ABS: 1.7 10*3/uL (ref 0.7–4.0)
Lymphocytes Relative: 17 %
MCH: 31.9 pg (ref 26.0–34.0)
MCHC: 34.4 g/dL (ref 30.0–36.0)
MCV: 92.6 fL (ref 78.0–100.0)
MONO ABS: 1 10*3/uL (ref 0.1–1.0)
Monocytes Relative: 10 %
Neutro Abs: 6.8 10*3/uL (ref 1.7–7.7)
Neutrophils Relative %: 64 %
Platelets: 264 10*3/uL (ref 150–400)
RBC: 4.3 MIL/uL (ref 4.22–5.81)
RDW: 13.9 % (ref 11.5–15.5)
WBC: 10.5 10*3/uL (ref 4.0–10.5)

## 2017-01-26 LAB — COMPREHENSIVE METABOLIC PANEL
ALBUMIN: 4.1 g/dL (ref 3.5–5.0)
ALT: 16 U/L — AB (ref 17–63)
AST: 23 U/L (ref 15–41)
Alkaline Phosphatase: 46 U/L (ref 38–126)
Anion gap: 7 (ref 5–15)
BILIRUBIN TOTAL: 0.5 mg/dL (ref 0.3–1.2)
BUN: 13 mg/dL (ref 6–20)
CHLORIDE: 103 mmol/L (ref 101–111)
CO2: 27 mmol/L (ref 22–32)
CREATININE: 1.04 mg/dL (ref 0.61–1.24)
Calcium: 9.1 mg/dL (ref 8.9–10.3)
GFR calc Af Amer: >60 mL/min (ref >60)
GLUCOSE: 100 mg/dL — AB (ref 65–99)
Potassium: 4.4 mmol/L (ref 3.5–5.1)
SODIUM: 137 mmol/L (ref 135–145)
TOTAL PROTEIN: 6.5 g/dL (ref 6.5–8.1)

## 2017-01-26 LAB — I-STAT CG4 LACTIC ACID, ED: LACTIC ACID, VENOUS: 0.49 mmol/L — AB (ref 0.5–1.9)

## 2017-01-26 NOTE — ED Notes (Signed)
Pt called x2. No answer. Not in lobby.  

## 2017-01-26 NOTE — ED Notes (Signed)
Pt called x 1 no answer

## 2017-01-26 NOTE — ED Notes (Signed)
Pt called x3. Not in lobby,  

## 2017-01-26 NOTE — ED Triage Notes (Signed)
Pt complaint of left hand swelling, redness, and pain post insect bite of unknown origin; noticed 2 days ago.

## 2017-01-31 ENCOUNTER — Encounter (HOSPITAL_COMMUNITY): Payer: Self-pay

## 2017-01-31 ENCOUNTER — Emergency Department (HOSPITAL_COMMUNITY): Payer: 59

## 2017-01-31 ENCOUNTER — Emergency Department (HOSPITAL_COMMUNITY)
Admission: EM | Admit: 2017-01-31 | Discharge: 2017-01-31 | Disposition: A | Discharge status: Home / Self Care | Payer: 59 | Attending: Emergency Medicine | Admitting: Emergency Medicine

## 2017-01-31 DIAGNOSIS — I10 Essential (primary) hypertension: Secondary | ICD-10-CM | POA: Diagnosis not present

## 2017-01-31 DIAGNOSIS — Y990 Civilian activity done for income or pay: Secondary | ICD-10-CM | POA: Diagnosis not present

## 2017-01-31 DIAGNOSIS — F17210 Nicotine dependence, cigarettes, uncomplicated: Secondary | ICD-10-CM | POA: Insufficient documentation

## 2017-01-31 DIAGNOSIS — Y939 Activity, unspecified: Secondary | ICD-10-CM | POA: Insufficient documentation

## 2017-01-31 DIAGNOSIS — S0101XA Laceration without foreign body of scalp, initial encounter: Secondary | ICD-10-CM | POA: Insufficient documentation

## 2017-01-31 DIAGNOSIS — W01198A Fall on same level from slipping, tripping and stumbling with subsequent striking against other object, initial encounter: Secondary | ICD-10-CM | POA: Insufficient documentation

## 2017-01-31 DIAGNOSIS — Z79899 Other long term (current) drug therapy: Secondary | ICD-10-CM | POA: Diagnosis not present

## 2017-01-31 DIAGNOSIS — Y9259 Other trade areas as the place of occurrence of the external cause: Secondary | ICD-10-CM | POA: Insufficient documentation

## 2017-01-31 DIAGNOSIS — Z23 Encounter for immunization: Secondary | ICD-10-CM | POA: Insufficient documentation

## 2017-01-31 DIAGNOSIS — S0990XA Unspecified injury of head, initial encounter: Secondary | ICD-10-CM | POA: Diagnosis present

## 2017-01-31 DIAGNOSIS — R569 Unspecified convulsions: Secondary | ICD-10-CM

## 2017-01-31 DIAGNOSIS — W19XXXA Unspecified fall, initial encounter: Secondary | ICD-10-CM

## 2017-01-31 LAB — CBC
HCT: 42.8 % (ref 39.0–52.0)
Hemoglobin: 14.6 g/dL (ref 13.0–17.0)
MCH: 31 pg (ref 26.0–34.0)
MCHC: 34.1 g/dL (ref 30.0–36.0)
MCV: 90.9 fL (ref 78.0–100.0)
Platelets: 307 K/uL (ref 150–400)
RBC: 4.71 MIL/uL (ref 4.22–5.81)
RDW: 13.6 % (ref 11.5–15.5)
WBC: 11.4 K/uL — ABNORMAL HIGH (ref 4.0–10.5)

## 2017-01-31 LAB — BASIC METABOLIC PANEL WITH GFR
Anion gap: 10 (ref 5–15)
BUN: 11 mg/dL (ref 6–20)
CO2: 25 mmol/L (ref 22–32)
Calcium: 9.1 mg/dL (ref 8.9–10.3)
Chloride: 102 mmol/L (ref 101–111)
Creatinine, Ser: 1.05 mg/dL (ref 0.61–1.24)
GFR calc Af Amer: >60 mL/min
GFR calc non Af Amer: >60 mL/min
Glucose, Bld: 84 mg/dL (ref 65–99)
Potassium: 4 mmol/L (ref 3.5–5.1)
Sodium: 137 mmol/L (ref 135–145)

## 2017-01-31 LAB — RAPID URINE DRUG SCREEN, HOSP PERFORMED
AMPHETAMINES: NOT DETECTED
BENZODIAZEPINES: POSITIVE — AB
Barbiturates: NOT DETECTED
Cocaine: NOT DETECTED
OPIATES: NOT DETECTED
TETRAHYDROCANNABINOL: POSITIVE — AB

## 2017-01-31 LAB — CBG MONITORING, ED: Glucose-Capillary: 87 mg/dL (ref 65–99)

## 2017-01-31 LAB — PHENYTOIN LEVEL, TOTAL: Phenytoin Lvl: <2.5 ug/mL — ABNORMAL LOW (ref 10.0–20.0)

## 2017-01-31 MED ORDER — PHENYTOIN SODIUM EXTENDED 100 MG PO CAPS: 300.0000 mg | ORAL_CAPSULE | Freq: Every day | ORAL | 0 refills | Status: DC

## 2017-01-31 MED ORDER — SODIUM CHLORIDE 0.9 % IV SOLN
1000.0000 mg | Freq: Once | INTRAVENOUS | Status: AC
  Administered 2017-01-31: 1000 mg via INTRAVENOUS
  Filled 2017-01-31: qty 20

## 2017-01-31 MED ORDER — ACETAMINOPHEN 325 MG PO TABS
650.0000 mg | ORAL_TABLET | Freq: Once | ORAL | Status: AC
  Administered 2017-01-31: 650 mg via ORAL
  Filled 2017-01-31: qty 2

## 2017-01-31 MED ORDER — LAMOTRIGINE 100 MG PO TABS
200.0000 mg | ORAL_TABLET | Freq: Once | ORAL | Status: AC
  Administered 2017-01-31: 200 mg via ORAL
  Filled 2017-01-31: qty 2

## 2017-01-31 MED ORDER — LIDOCAINE-EPINEPHRINE (PF) 2 %-1:200000 IJ SOLN
10.0000 mL | Freq: Once | INTRAMUSCULAR | Status: AC
  Administered 2017-01-31: 10 mL
  Filled 2017-01-31: qty 20

## 2017-01-31 MED ORDER — TETANUS-DIPHTH-ACELL PERTUSSIS 5-2.5-18.5 LF-MCG/0.5 IM SUSP
0.5000 mL | Freq: Once | INTRAMUSCULAR | Status: AC
  Administered 2017-01-31: 0.5 mL via INTRAMUSCULAR
  Filled 2017-01-31: qty 0.5

## 2017-01-31 MED ORDER — IBUPROFEN 200 MG PO TABS
600.0000 mg | ORAL_TABLET | Freq: Once | ORAL | Status: AC
  Administered 2017-01-31: 600 mg via ORAL
  Filled 2017-01-31: qty 3

## 2017-01-31 MED ORDER — LAMOTRIGINE 200 MG PO TABS: 200.0000 mg | ORAL_TABLET | Freq: Every day | ORAL | 0 refills | Status: DC

## 2017-01-31 NOTE — Discharge Instructions (Signed)
You cannot continue to drive until you follow-up with her neurologist. It is important that you take your seizure medications as prescribed. Make sure you avoid any drug use which can lower your seizure threshold. It is important that she follow up with your neurologist as soon as possible. You're staplesto come out in 5-7 days. Watch out for signs of infection including redness, fever, drainage.

## 2017-01-31 NOTE — ED Notes (Signed)
Discharge instructions reviewed with patient. Patient verbalizes understanding. VSS.   

## 2017-01-31 NOTE — ED Notes (Signed)
Bed: WA24 Expected date:  Expected time:  Means of arrival:  Comments: EMS - Seizure 

## 2017-01-31 NOTE — ED Triage Notes (Addendum)
Patient BIB EMS from work. Per report, patient had a grand mal seizure lasting approx 2 minutes from standing position. Per report, patient fell and hit his head on the counter in the breakroom. Patient has laceration to right side of the head in triage- bleeding controlled. Patient does not take blood thinners,per report. Patient Alert and oriented x4 in triage. Denies neck pain/tongue pain. Patient reports headache 6/10 at this time. Patient reports he has NOT been taking his 2 different anti-seizure medications regularly.

## 2017-02-01 LAB — LAMOTRIGINE LEVEL: LAMOTRIGINE LVL: NOT DETECTED ug/mL (ref 2.0–20.0)

## 2017-02-01 NOTE — ED Provider Notes (Signed)
[facility]-EMERGENCY DEPT Provider Note   CSN: [id number] Arrival date & time: 01/31/17  1449     History   Chief Complaint Chief Complaint  Patient presents with  . Seizures    HPI Ryan Fernandez is a 31 y.o. male.  HPI 31 year old Caucasian male past medical history significant for polysubstance abuse, seizures, poor compliance of medication presents to the emergency department today with complaints of a seizure while at work. Report from EMS patient was at work and had a grand mal seizure lasting approximately 2 minutes from a standing position. Patient states the last thing remembers coming back from lunch. The report states the patient fell and hit his head on the counter in the break room. Patient complains of a headache and jaw pain. Patient did not have any tongue trauma. No bladder or bowel incontinence. Patient has been ambulatory since the event. Laceration noted to the right side of the head. Patient states that his tetanus shot is not up-to-date. Patient denies any recent illnesses. Does report smoking marijuana. Patient also admits to not being compliant with his 2 antiseizure medications. States that he has not been taking for the past week for unknown reason. Patient does not have prescriptions at home. Patient has had nothing for the pain prior to arrival. He denies any associated fever, chills, vision changes, neck pain, lightheadedness, dizziness, chest pain, shortness of breath, nausea, emesis, abdominal pain, urinary symptoms, change in bowel habits, paresthesias. Past Medical History:  Diagnosis Date  . Seizures [facility])     Patient Active Problem List   Diagnosis Date Noted  . TOBACCO ABUSE 08/20/2009  . HYPERTENSION, BORDERLINE 08/20/2009  . SEIZURE DISORDER 08/20/2009  . CHEST PAIN-UNSPECIFIED 08/20/2009  . SYNCOPE, HX OF 08/20/2009    Past Surgical History:  Procedure Laterality Date  . MIDDLE EAR SURGERY         Home Medications    Prior to Admission  medications   Medication Sig Start Date End Date Taking? Authorizing Provider  ALPRAZolam (XANAX XR) 2 MG 24 hr tablet Take 2 mg by mouth 2 (two) times daily.   Yes [provider]  chlordiazePOXIDE (LIBRIUM) 25 MG capsule  PO TID x 1D, then 25-50mg  PO BID X 1D, then 25-50mg  PO QD X 1D Patient not taking: Reported on 01/31/2017 09/28/16   Benjiman Core, MD  clindamycin (CLEOCIN) 300 MG capsule Take 1 capsule (300 mg total) by mouth 3 (three) times daily. Patient not taking: Reported on 01/31/2017 09/02/16   Demetrios Loll T, PA-C  ibuprofen (ADVIL,MOTRIN) 200 MG tablet Take 800 mg by mouth every 6 (six) hours as needed (tooth pain).    [provider]  lamoTRIgine (LAMICTAL) 200 MG tablet Take 1 tablet (200 mg total) by mouth daily. 01/31/17   Rise Mu, PA-C  phenytoin (DILANTIN) 100 MG ER capsule Take 3 capsules (300 mg total) by mouth at bedtime. 01/31/17   Rise Mu, PA-C    Family History History reviewed. No pertinent family history.  Social History Social History  Substance Use Topics  . Smoking status: Current Every Day Smoker    Packs/day: 1.00    Types: Cigarettes  . Smokeless tobacco: Never Used  . Alcohol use Yes     Allergies   Penicillins   Review of Systems Review of Systems  Constitutional: Negative for chills and fever.  HENT: Negative for congestion and sore throat.   Eyes: Negative for visual disturbance.  Respiratory: Negative for cough and shortness of breath.  Cardiovascular: Negative for chest pain.  Gastrointestinal: Negative for abdominal pain, diarrhea, nausea and vomiting.  Genitourinary: Negative for dysuria, flank pain, frequency, hematuria and urgency.  Musculoskeletal: Positive for arthralgias and myalgias.  Skin: Positive for wound. Negative for rash.  Neurological: Positive for seizures. Negative for dizziness, syncope, weakness, light-headedness, numbness and headaches.  Psychiatric/Behavioral:  Negative for sleep disturbance. The patient is not nervous/anxious.      Physical Exam Updated Vital Signs BP (!) 156/101   Pulse 65   Temp 98 F (36.7 C) (Oral)   Resp 14   Wt 79.4 kg (175 lb)   SpO2 100%   BMI 24.41 kg/m   Physical Exam  Constitutional: He is oriented to person, place, and time. He appears well-developed and well-nourished.  Non-toxic appearance. No distress.  HENT:  Head: Normocephalic.    Mouth/Throat: Oropharynx is clear and moist.  1 cm laceration to the right temporal region with bleeding controlled. Mild tender to palpation. No skull depression. No bilateral hemotympanum. No septal hematoma.  Patient with pain with range of motion of his right jaw. Patient has no trismus. No loose dentition. Oropharynx is clear. No obvious ecchymosis, edema, crepitus, deformity noted.  Eyes: Pupils are equal, round, and reactive to light. Conjunctivae are normal. Right eye exhibits no discharge. Left eye exhibits no discharge.  Neck: Normal range of motion. Neck supple.  No c spine midline tenderness. No paraspinal tenderness. No deformities or step offs noted. Full ROM. Supple. No nuchal rigidity.    Cardiovascular: Normal rate, regular rhythm, normal heart sounds and intact distal pulses.  Exam reveals no gallop and no friction rub.   No murmur heard. Pulmonary/Chest: Effort normal and breath sounds normal. No respiratory distress. He has no wheezes. He has no rales. He exhibits no tenderness.  Abdominal: Soft. Bowel sounds are normal. He exhibits no distension. There is no tenderness. There is no rebound and no guarding.  Musculoskeletal: Normal range of motion. He exhibits no tenderness.  No midline T spine or L spine tenderness. No deformities or step offs noted. Full ROM. Pelvis is stable.   Lymphadenopathy:    He has no cervical adenopathy.  Neurological: He is alert and oriented to person, place, and time.  The patient is alert, attentive, and oriented x 3.  Speech is clear. Cranial nerve II-VII grossly intact. Negative pronator drift. Sensation intact. Strength 5/5 in all extremities. Reflexes 2+ and symmetric at biceps, triceps, knees, and ankles. Rapid alternating movement and fine finger movements intact.   Skin: Skin is warm and dry. Capillary refill takes less than 2 seconds. No rash noted.  Psychiatric: His behavior is normal. Judgment and thought content normal.  Nursing note and vitals reviewed.    ED Treatments / Results  Labs (all labs ordered are listed, but only abnormal results are displayed) Labs Reviewed  PHENYTOIN LEVEL, TOTAL - Abnormal; Notable for the following:       Result Value   Phenytoin Lvl <2.5 (*)    All other components within normal limits  CBC - Abnormal; Notable for the following:    WBC 11.4 (*)    All other components within normal limits  RAPID URINE DRUG SCREEN, HOSP PERFORMED - Abnormal; Notable for the following:    Benzodiazepines POSITIVE (*)    Tetrahydrocannabinol POSITIVE (*)    All other components within normal limits  BASIC METABOLIC PANEL  LAMOTRIGINE LEVEL  CBG MONITORING, ED    EKG  EKG Interpretation None  Radiology Dg Cervical Spine Complete  Result Date: 01/31/2017 CLINICAL DATA:  Status post fall. EXAM: CERVICAL SPINE - COMPLETE 4+ VIEW COMPARISON:  None. FINDINGS: There is no evidence of cervical spine fracture or prevertebral soft tissue swelling. Alignment is normal. There is chronic fusion of C 3-4. No other significant bone abnormalities are identified. IMPRESSION: No acute fracture or dislocation identified. Electronically Signed   By: Sherian Rein M.D.   On: 01/31/2017 17:16   Ct Head Wo Contrast  Result Date: 01/31/2017 CLINICAL DATA:  Grand mal seizure 2 minutes duration from standing position, fell and struck head on counter in break room, RIGHT-side head laceration, history of seizures, smoker EXAM: CT HEAD WITHOUT CONTRAST TECHNIQUE: Contiguous axial images  were obtained from the base of the skull through the vertex without intravenous contrast. Sagittal and coronal MPR images reconstructed from axial data set. COMPARISON:  11/16/2014 FINDINGS: Brain: Normal ventricular morphology. No midline shift or mass effect. Normal appearance of brain parenchyma. No intracranial hemorrhage, mass lesion or evidence of acute infarction. No extra-axial fluid collections. Vascular: Normal appearance Skull: Intact Sinuses/Orbits: Clear Other: N/A IMPRESSION: Normal exam. Electronically Signed   By: Ulyses Southward M.D.   On: 01/31/2017 17:40   Ct Maxillofacial Wo Cm  Result Date: 01/31/2017 CLINICAL DATA:  Head trauma and injury. EXAM: CT MAXILLOFACIAL WITHOUT CONTRAST TECHNIQUE: Multidetector CT imaging of the maxillofacial structures was performed. Multiplanar CT image reconstructions were also generated. COMPARISON:  Head CT August 25, 2009 FINDINGS: Osseous: There is no acute fracture or dislocation. There is chronic nasal septal deviation from right to left unchanged. Orbits: Negative. No traumatic or inflammatory finding. Sinuses: Mucoperiosteal thickening of the bilateral maxillary sinuses are identified. The bilateral ostial meatal complexes are patent. Soft tissues: Negative. Limited intracranial: No significant or unexpected finding. IMPRESSION: No acute fracture or dislocation identified. Mucoperiosteal thickening of bilateral maxillary sinus without sinusitis. Electronically Signed   By: Sherian Rein M.D.   On: 01/31/2017 18:33    Procedures .Marland KitchenLaceration Repair Date/Time: 02/01/2017 12:47 AM Performed by: Rise Mu Authorized by: Demetrios Loll T   Consent:    Consent obtained:  Verbal   Consent given by:  Patient   Risks discussed:  Infection, need for additional repair, nerve damage, poor wound healing, poor cosmetic result, retained foreign body, tendon damage, vascular damage and pain   Alternatives discussed:  No treatment Anesthesia (see MAR  for exact dosages):    Anesthesia method:  Local infiltration   Local anesthetic:  Lidocaine 1% WITH epi Laceration details:    Location:  Scalp   Scalp location:  R temporal   Length (cm):  1   Depth (mm):  5 Repair type:    Repair type:  Simple Pre-procedure details:    Preparation:  Patient was prepped and draped in usual sterile fashion and imaging obtained to evaluate for foreign bodies Exploration:    Hemostasis achieved with:  Direct pressure   Wound exploration: wound explored through full range of motion and entire depth of wound probed and visualized     Wound extent: no foreign bodies/material noted and no underlying fracture noted     Contaminated: no   Treatment:    Area cleansed with:  Betadine and saline   Amount of cleaning:  Standard   Irrigation solution:  Sterile saline   Irrigation volume:  100   Irrigation method:  Syringe   Visualized foreign bodies/material removed: no   Skin repair:    Repair method:  Staples  Number of staples:  2 Approximation:    Approximation:  Close   Vermilion border: well-aligned   Post-procedure details:    Dressing:  Antibiotic ointment   Patient tolerance of procedure:  Tolerated well, no immediate complications   (including critical care time)  Medications Ordered in ED Medications  lidocaine-EPINEPHrine (XYLOCAINE W/EPI) 2 %-1:200000 (PF) injection 10 mL (10 mLs Infiltration Given by Other 01/31/17 1627)  Tdap (BOOSTRIX) injection 0.5 mL (0.5 mLs Intramuscular Given 01/31/17 1627)  phenytoin (DILANTIN) 1,000 mg in sodium chloride 0.9 % 250 mL IVPB (0 mg Intravenous Stopped 01/31/17 1835)  lamoTRIgine (LAMICTAL) tablet 200 mg (200 mg Oral Given 01/31/17 1627)  ibuprofen (ADVIL,MOTRIN) tablet 600 mg (600 mg Oral Given 01/31/17 1849)  acetaminophen (TYLENOL) tablet 650 mg (650 mg Oral Given 01/31/17 1849)     Initial Impression / Assessment and Plan / ED Course  I have reviewed the triage vital signs and the nursing  notes.  Pertinent labs & imaging results that were available during my care of the patient were reviewed by me and considered in my medical decision making (see chart for details).     Patient presents to the ED after having a seizure at work today. Patient did hit his head and sustaining a laceration. Tetanus shot is not up-to-date. This was updated in the ED. Laceration was repaired. Patient with polysubstance abuse and poor compliance with seizure medications. Patient with no evidence of focal neuro deficits on physical exam and is at mental baseline.  Labs and imaging have been reviewed. No fractures on imaging. Patient is advised to followup with neurologist in regards to today's event.  Spoke with patient and family in detail about driving restrictions until cleared by a neurologist.  Patient was loaded with Dilantin and given his home dose of Lamictal. Patient prescribed short course of seizure medication. Encouraged close follow up with pcp and neuro.   The patient was watched for several hours in the ED and had no further seizure like activity. Ambulate with normal gait.   Tdap booster given.Pressure irrigation performed. Laceration occurred < 8 hours prior to repair which was well tolerated. Pt has no co morbidities to effect normal wound healing. Discussed staple home care w pt and answered questions. Pt to f-u for wound check and suture removal in 7 days.   Pt is hemodynamically stable, in NAD, & able to ambulate in the ED. Evaluation does not show pathology that would require ongoing emergent intervention or inpatient treatment. I explained the diagnosis to the patient. Pain has been managed & has no complaints prior to dc. Pt is comfortable with above plan and is stable for discharge at this time. All questions were answered prior to disposition. Strict return precautions for f/u to the ED were discussed. Encouraged follow up with PCP.  Dicussed with Dr. Jeraldine Loots who is agreeable to the  above plan.    linical Impressions(s) / ED Diagnoses   Final diagnoses:  Fall  Seizure [facility])  Laceration of scalp, initial encounter    New Prescriptions Discharge Medication List as of 01/31/2017  7:52 PM       Rise Mu, PA-C 02/01/17 0049    Gerhard Munch, MD 02/02/17 1727

## 2017-04-18 ENCOUNTER — Other Ambulatory Visit: Payer: Self-pay

## 2017-04-18 ENCOUNTER — Emergency Department (HOSPITAL_BASED_OUTPATIENT_CLINIC_OR_DEPARTMENT_OTHER)
Admission: EM | Admit: 2017-04-18 | Discharge: 2017-04-18 | Disposition: A | Discharge status: Home / Self Care | Payer: 59 | Attending: Emergency Medicine | Admitting: Emergency Medicine

## 2017-04-18 ENCOUNTER — Encounter (HOSPITAL_BASED_OUTPATIENT_CLINIC_OR_DEPARTMENT_OTHER): Payer: Self-pay | Admitting: Emergency Medicine

## 2017-04-18 DIAGNOSIS — Z5321 Procedure and treatment not carried out due to patient leaving prior to being seen by health care provider: Secondary | ICD-10-CM | POA: Insufficient documentation

## 2017-04-18 DIAGNOSIS — K0889 Other specified disorders of teeth and supporting structures: Secondary | ICD-10-CM | POA: Insufficient documentation

## 2017-04-18 HISTORY — DX: Opioid dependence, uncomplicated: F11.20

## 2017-04-18 HISTORY — DX: Alcohol abuse, uncomplicated: F10.10

## 2017-04-18 NOTE — ED Triage Notes (Signed)
C/o multiple dental abscess x 2 days-NAD-steady gait

## 2017-04-18 NOTE — ED Notes (Signed)
Pt advised of wait after triage-states he is not waiting to be seen and is leaving-NAD-left with adult male

## 2017-06-01 ENCOUNTER — Other Ambulatory Visit: Payer: Self-pay

## 2017-06-01 ENCOUNTER — Encounter (HOSPITAL_BASED_OUTPATIENT_CLINIC_OR_DEPARTMENT_OTHER): Payer: Self-pay | Admitting: Emergency Medicine

## 2017-06-01 DIAGNOSIS — R079 Chest pain, unspecified: Secondary | ICD-10-CM | POA: Diagnosis present

## 2017-06-01 DIAGNOSIS — R030 Elevated blood-pressure reading, without diagnosis of hypertension: Secondary | ICD-10-CM | POA: Diagnosis present

## 2017-06-01 DIAGNOSIS — F17210 Nicotine dependence, cigarettes, uncomplicated: Secondary | ICD-10-CM | POA: Diagnosis not present

## 2017-06-01 DIAGNOSIS — I2699 Other pulmonary embolism without acute cor pulmonale: Secondary | ICD-10-CM | POA: Diagnosis not present

## 2017-06-01 DIAGNOSIS — Z23 Encounter for immunization: Secondary | ICD-10-CM

## 2017-06-01 DIAGNOSIS — G40909 Epilepsy, unspecified, not intractable, without status epilepticus: Secondary | ICD-10-CM | POA: Diagnosis present

## 2017-06-01 DIAGNOSIS — F319 Bipolar disorder, unspecified: Secondary | ICD-10-CM | POA: Diagnosis not present

## 2017-06-01 DIAGNOSIS — Z79899 Other long term (current) drug therapy: Secondary | ICD-10-CM | POA: Diagnosis not present

## 2017-06-01 NOTE — ED Triage Notes (Signed)
Pt presents with c/o left sided rib pain since last night without injury. Pt reports he was sleeping and woke up coughing and having left sided pain and then started coughing up blood.

## 2017-06-02 ENCOUNTER — Emergency Department (HOSPITAL_BASED_OUTPATIENT_CLINIC_OR_DEPARTMENT_OTHER): Payer: 59

## 2017-06-02 ENCOUNTER — Observation Stay (HOSPITAL_BASED_OUTPATIENT_CLINIC_OR_DEPARTMENT_OTHER)
Admission: EM | Admit: 2017-06-02 | Discharge: 2017-06-05 | DRG: 176 | Disposition: A | Discharge status: Home / Self Care | Payer: 59 | Attending: Family Medicine | Admitting: Family Medicine

## 2017-06-02 DIAGNOSIS — R030 Elevated blood-pressure reading, without diagnosis of hypertension: Secondary | ICD-10-CM | POA: Diagnosis not present

## 2017-06-02 DIAGNOSIS — I2699 Other pulmonary embolism without acute cor pulmonale: Secondary | ICD-10-CM

## 2017-06-02 DIAGNOSIS — F3170 Bipolar disorder, currently in remission, most recent episode unspecified: Secondary | ICD-10-CM

## 2017-06-02 DIAGNOSIS — G40909 Epilepsy, unspecified, not intractable, without status epilepticus: Secondary | ICD-10-CM | POA: Diagnosis not present

## 2017-06-02 DIAGNOSIS — F1111 Opioid abuse, in remission: Secondary | ICD-10-CM

## 2017-06-02 DIAGNOSIS — F319 Bipolar disorder, unspecified: Secondary | ICD-10-CM

## 2017-06-02 DIAGNOSIS — Z87898 Personal history of other specified conditions: Secondary | ICD-10-CM | POA: Diagnosis not present

## 2017-06-02 DIAGNOSIS — F17200 Nicotine dependence, unspecified, uncomplicated: Secondary | ICD-10-CM | POA: Diagnosis not present

## 2017-06-02 LAB — COMPREHENSIVE METABOLIC PANEL
ALBUMIN: 3.8 g/dL (ref 3.5–5.0)
ALK PHOS: 60 U/L (ref 38–126)
ALT: 24 U/L (ref 17–63)
ANION GAP: 8 (ref 5–15)
AST: 21 U/L (ref 15–41)
BUN: 9 mg/dL (ref 6–20)
CALCIUM: 8.8 mg/dL — AB (ref 8.9–10.3)
CO2: 28 mmol/L (ref 22–32)
Chloride: 99 mmol/L — ABNORMAL LOW (ref 101–111)
Creatinine, Ser: 0.95 mg/dL (ref 0.61–1.24)
Glucose, Bld: 114 mg/dL — ABNORMAL HIGH (ref 65–99)
POTASSIUM: 3.7 mmol/L (ref 3.5–5.1)
Sodium: 135 mmol/L (ref 135–145)
TOTAL PROTEIN: 7 g/dL (ref 6.5–8.1)
Total Bilirubin: 0.8 mg/dL (ref 0.3–1.2)

## 2017-06-02 LAB — CBC
HEMATOCRIT: 40.9 % (ref 39.0–52.0)
HEMOGLOBIN: 13.9 g/dL (ref 13.0–17.0)
MCH: 31.5 pg (ref 26.0–34.0)
MCHC: 34 g/dL (ref 30.0–36.0)
MCV: 92.7 fL (ref 78.0–100.0)
Platelets: 240 10*3/uL (ref 150–400)
RBC: 4.41 MIL/uL (ref 4.22–5.81)
RDW: 13.6 % (ref 11.5–15.5)
WBC: 11.4 10*3/uL — AB (ref 4.0–10.5)

## 2017-06-02 LAB — BASIC METABOLIC PANEL
ANION GAP: 4 — AB (ref 5–15)
BUN: 12 mg/dL (ref 6–20)
CHLORIDE: 102 mmol/L (ref 101–111)
CO2: 29 mmol/L (ref 22–32)
Calcium: 8.8 mg/dL — ABNORMAL LOW (ref 8.9–10.3)
Creatinine, Ser: 1.22 mg/dL (ref 0.61–1.24)
GFR calc Af Amer: >60 mL/min (ref >60)
GFR calc non Af Amer: >60 mL/min (ref >60)
GLUCOSE: 99 mg/dL (ref 65–99)
POTASSIUM: 4.1 mmol/L (ref 3.5–5.1)
Sodium: 135 mmol/L (ref 135–145)

## 2017-06-02 LAB — PROTIME-INR
INR: 0.88
Prothrombin Time: 11.9 seconds (ref 11.4–15.2)

## 2017-06-02 LAB — HEPARIN LEVEL (UNFRACTIONATED): Heparin Unfractionated: 0.96 IU/mL — ABNORMAL HIGH (ref 0.30–0.70)

## 2017-06-02 MED ORDER — ENOXAPARIN SODIUM 80 MG/0.8ML ~~LOC~~ SOLN
80.0000 mg | Freq: Two times a day (BID) | SUBCUTANEOUS | Status: DC
  Administered 2017-06-03 – 2017-06-05 (×5): 80 mg via SUBCUTANEOUS
  Filled 2017-06-02 (×7): qty 0.8

## 2017-06-02 MED ORDER — GABAPENTIN 400 MG PO CAPS
800.0000 mg | ORAL_CAPSULE | Freq: Four times a day (QID) | ORAL | Status: DC
  Administered 2017-06-02 – 2017-06-05 (×10): 800 mg via ORAL
  Filled 2017-06-02 (×10): qty 2

## 2017-06-02 MED ORDER — HEPARIN (PORCINE) IN NACL 100-0.45 UNIT/ML-% IJ SOLN
1100.0000 [IU]/h | INTRAMUSCULAR | Status: DC
  Administered 2017-06-02: 1300 [IU]/h via INTRAVENOUS
  Filled 2017-06-02: qty 250

## 2017-06-02 MED ORDER — LAMOTRIGINE 100 MG PO TABS
300.0000 mg | ORAL_TABLET | Freq: Every day | ORAL | Status: DC
  Administered 2017-06-02 – 2017-06-05 (×4): 300 mg via ORAL
  Filled 2017-06-02 (×4): qty 3

## 2017-06-02 MED ORDER — OXYCODONE-ACETAMINOPHEN 5-325 MG PO TABS
2.0000 | ORAL_TABLET | Freq: Four times a day (QID) | ORAL | Status: DC | PRN
  Administered 2017-06-02 – 2017-06-04 (×6): 2 via ORAL
  Filled 2017-06-02 (×7): qty 2

## 2017-06-02 MED ORDER — ALPRAZOLAM ER 1 MG PO TB24: 2.0000 mg | ORAL_TABLET | Freq: Two times a day (BID) | ORAL | Status: DC

## 2017-06-02 MED ORDER — KETOROLAC TROMETHAMINE 30 MG/ML IJ SOLN
30.0000 mg | Freq: Once | INTRAMUSCULAR | Status: AC
  Administered 2017-06-02: 30 mg via INTRAVENOUS
  Filled 2017-06-02: qty 1

## 2017-06-02 MED ORDER — MORPHINE SULFATE (PF) 4 MG/ML IV SOLN
4.0000 mg | INTRAVENOUS | Status: AC | PRN
  Administered 2017-06-02 (×4): 4 mg via INTRAVENOUS
  Filled 2017-06-02 (×5): qty 1

## 2017-06-02 MED ORDER — APIXABAN 5 MG PO TABS
10.0000 mg | ORAL_TABLET | Freq: Two times a day (BID) | ORAL | Status: DC
  Administered 2017-06-02: 10 mg via ORAL
  Filled 2017-06-02: qty 2

## 2017-06-02 MED ORDER — APIXABAN 5 MG PO TABS: 5.0000 mg | ORAL_TABLET | Freq: Two times a day (BID) | ORAL | Status: DC

## 2017-06-02 MED ORDER — PHENYTOIN SODIUM EXTENDED 100 MG PO CAPS
300.0000 mg | ORAL_CAPSULE | Freq: Every day | ORAL | Status: DC
  Administered 2017-06-02 – 2017-06-04 (×3): 300 mg via ORAL
  Filled 2017-06-02 (×3): qty 3

## 2017-06-02 MED ORDER — SODIUM CHLORIDE 0.9% FLUSH
3.0000 mL | Freq: Two times a day (BID) | INTRAVENOUS | Status: DC
  Administered 2017-06-02 – 2017-06-05 (×6): 3 mL via INTRAVENOUS

## 2017-06-02 MED ORDER — OXYCODONE-ACETAMINOPHEN 5-325 MG PO TABS
1.0000 | ORAL_TABLET | Freq: Once | ORAL | Status: AC
  Administered 2017-06-02: 1 via ORAL
  Filled 2017-06-02: qty 1

## 2017-06-02 MED ORDER — ALPRAZOLAM ER 1 MG PO TB24
2.0000 mg | ORAL_TABLET | Freq: Two times a day (BID) | ORAL | Status: DC
  Administered 2017-06-02 – 2017-06-05 (×6): 2 mg via ORAL
  Filled 2017-06-02 (×7): qty 2

## 2017-06-02 MED ORDER — NICOTINE 21 MG/24HR TD PT24
21.0000 mg | MEDICATED_PATCH | Freq: Once | TRANSDERMAL | Status: AC
  Administered 2017-06-02: 21 mg via TRANSDERMAL
  Filled 2017-06-02: qty 1

## 2017-06-02 MED ORDER — HEPARIN BOLUS VIA INFUSION
4700.0000 [IU] | Freq: Once | INTRAVENOUS | Status: AC
  Administered 2017-06-02: 4700 [IU] via INTRAVENOUS

## 2017-06-02 MED ORDER — MORPHINE SULFATE (PF) 4 MG/ML IV SOLN
4.0000 mg | Freq: Once | INTRAVENOUS | Status: AC
  Administered 2017-06-02: 4 mg via INTRAVENOUS
  Filled 2017-06-02: qty 1

## 2017-06-02 MED ORDER — SODIUM CHLORIDE 0.9% FLUSH: 3.0000 mL | INTRAVENOUS | Status: DC | PRN

## 2017-06-02 MED ORDER — IOPAMIDOL (ISOVUE-370) INJECTION 76%
100.0000 mL | Freq: Once | INTRAVENOUS | Status: AC | PRN
  Administered 2017-06-02: 100 mL via INTRAVENOUS

## 2017-06-02 MED ORDER — WARFARIN - PHARMACIST DOSING INPATIENT: Freq: Every day | Status: DC

## 2017-06-02 MED ORDER — WARFARIN SODIUM 5 MG PO TABS
5.0000 mg | ORAL_TABLET | Freq: Once | ORAL | Status: AC
  Administered 2017-06-02: 5 mg via ORAL
  Filled 2017-06-02: qty 1

## 2017-06-02 MED ORDER — SODIUM CHLORIDE 0.9 % IV SOLN: 250.0000 mL | INTRAVENOUS | Status: DC | PRN

## 2017-06-02 MED ORDER — NICOTINE 21 MG/24HR TD PT24
21.0000 mg | MEDICATED_PATCH | Freq: Every day | TRANSDERMAL | Status: DC
  Administered 2017-06-03 – 2017-06-05 (×3): 21 mg via TRANSDERMAL
  Filled 2017-06-02 (×4): qty 1

## 2017-06-02 NOTE — H&P (Signed)
History and Physical    Ryan Fernandez:[id number] DOB: 13-Aug-1985 DOA: 06/02/2017  PCP: Evalee Jefferson in [place] Patient coming from: home (by way of [facility])   Chief Complaint: pain with breathing, hemoptysis  HPI: Ryan Fernandez is a 32 y.o. male with medical history significant for seizure disorder, history of opioid abuse now in remission, history of alcohol abuse now in remission, tobacco abuse, bipolar disorder, presenting to [facility] today with one day of pleuritic chest pain, cough that developed to include small amounts of blood. Was in usual state of health prior. No history of dvt or pe. But says father has had multiple blood clots. No recent immobilization, surgery, hospitilization. No recent med changes. No fevers. No vomiting or diarrhea. Pain is bilateral and worse with deep breaths and lying flat. Denies injection drug use.   ED Course: heparin, labs, cta  Review of Systems: As per HPI otherwise 10 point review of systems negative.    Past Medical History:  Diagnosis Date  . Alcohol abuse   . Opiate addiction (HCC)   . Seizures (HCC)     Past Surgical History:  Procedure Laterality Date  . MIDDLE EAR SURGERY       reports that he has been smoking cigarettes.  He has been smoking about 1.00 pack per day. he has never used smokeless tobacco. He reports that he drinks alcohol. He reports that he uses drugs. Drug: Marijuana.  Allergies  Allergen Reactions  . Penicillins Other (See Comments)    Childhood allergy Has patient had a PCN reaction causing immediate rash, facial/tongue/throat swelling, SOB or lightheadedness with hypotension:Unknown Has patient had a PCN reaction causing severe rash involving mucus membranes or skin necrosis: Unknown Has patient had a PCN reaction that required hospitalization: Unknown: Has patient had a PCN reaction occurring within the last 10 years:NO  If all of the above answers are "NO", then may proceed with Cephalosporin use. .  .  Tramadol Rash    No family history on file.  Prior to Admission medications   Medication Sig Start Date End Date Taking? Authorizing Provider  ALPRAZolam (XANAX XR) 2 MG 24 hr tablet Take 2 mg by mouth 2 (two) times daily.   Yes [provider]  gabapentin (NEURONTIN) 800 MG tablet Take 800 mg by mouth 4 (four) times daily. 05/29/17  Yes [provider]  lamoTRIgine (LAMICTAL) 150 MG tablet Take 300 mg by mouth daily. 05/26/17  Yes [provider]  Multiple Vitamin (ONE-A-DAY MENS PO) Take 1 tablet by mouth daily.   Yes [provider]  phenytoin (DILANTIN) 100 MG ER capsule Take 3 capsules (300 mg total) by mouth at bedtime. 01/31/17  Yes Rise Mu, PA-C  lamoTRIgine (LAMICTAL) 200 MG tablet Take 1 tablet (200 mg total) by mouth daily. Patient not taking: Reported on 06/02/2017 01/31/17   Wallace Keller    Physical Exam: Vitals:   06/02/17 1530 06/02/17 1600 06/02/17 1630 06/02/17 1818  BP: (!) 150/90 (!) 161/100 (!) 162/106 (!) 158/108  Pulse: 76 79 78 86  Resp: 18 11 (!) 22   Temp:    99.2 F (37.3 C)  TempSrc:    Oral  SpO2: 98% 99% 96% 100%  Weight:      Height:    5\' 10"  (1.778 m)    Constitutional: No acute distress Head: Atraumatic Eyes: Conjunctiva clear ENM: Moist mucous membranes. Normal dentition.  Neck: Supple Respiratory: Clear to auscultation bilaterally, no wheezing/rales/rhonchi. Complains of pain w/  deep respiration Cardiovascular: Regular rate and rhythm. No murmurs/rubs/gallops. Abdomen: Non-tender, non-distended. No masses. No rebound or guarding. Positive bowel sounds. Musculoskeletal: No joint deformity upper and lower extremities. Normal ROM, no contractures. Normal muscle tone.  Skin: No rashes, lesions, or ulcers. Many tattoos Extremities: No peripheral edema. Palpable peripheral pulses. No calf tenderness/redness/swelling Neurologic: Alert, moving all 4 extremities. Psychiatric: Normal insight and  judgement.   Labs on Admission: I have personally reviewed following labs and imaging studies  CBC: Recent Labs  Lab 06/02/17 0126  WBC 11.4*  HGB 13.9  HCT 40.9  MCV 92.7  PLT 240   Basic Metabolic Panel: Recent Labs  Lab 06/02/17 0126  NA 135  K 4.1  CL 102  CO2 29  GLUCOSE 99  BUN 12  CREATININE 1.22  CALCIUM 8.8*   GFR: Estimated Creatinine Clearance: 90.6 mL/min (by C-G formula based on SCr of 1.22 mg/dL). Liver Function Tests: No results for input(s): AST, ALT, ALKPHOS, BILITOT, PROT, ALBUMIN in the last 168 hours. No results for input(s): LIPASE, AMYLASE in the last 168 hours. No results for input(s): AMMONIA in the last 168 hours. Coagulation Profile: Recent Labs  Lab 06/02/17 0126  INR 0.88   Cardiac Enzymes: No results for input(s): CKTOTAL, CKMB, CKMBINDEX, TROPONINI in the last 168 hours. BNP (last 3 results) No results for input(s): PROBNP in the last 8760 hours. HbA1C: No results for input(s): HGBA1C in the last 72 hours. CBG: No results for input(s): GLUCAP in the last 168 hours. Lipid Profile: No results for input(s): CHOL, HDL, LDLCALC, TRIG, CHOLHDL, LDLDIRECT in the last 72 hours. Thyroid Function Tests: No results for input(s): TSH, T4TOTAL, FREET4, T3FREE, THYROIDAB in the last 72 hours. Anemia Panel: No results for input(s): VITAMINB12, FOLATE, FERRITIN, TIBC, IRON, RETICCTPCT in the last 72 hours. Urine analysis:    Component Value Date/Time   COLORURINE AMBER (A) 03/12/2015 1715   APPEARANCEUR CLEAR 03/12/2015 1715   LABSPEC 1.023 03/12/2015 1715   PHURINE 6.0 03/12/2015 1715   GLUCOSEU NEGATIVE 03/12/2015 1715   HGBUR NEGATIVE 03/12/2015 1715   BILIRUBINUR SMALL (A) 03/12/2015 1715   KETONESUR 15 (A) 03/12/2015 1715   PROTEINUR 30 (A) 03/12/2015 1715   UROBILINOGEN 1.0 03/12/2015 1715   NITRITE NEGATIVE 03/12/2015 1715   LEUKOCYTESUR NEGATIVE 03/12/2015 1715    Radiological Exams on Admission: Dg Chest 2 View  Result  Date: 06/02/2017 CLINICAL DATA:  Left-sided rib pain EXAM: CHEST  2 VIEW COMPARISON:  03/12/2007 FINDINGS: Subsegmental atelectasis at the left base. No pleural effusion. Normal heart size. No pneumothorax. Mild wedge compression deformities of the midthoracic spine, new since 2008 radiograph IMPRESSION: Mild subsegmental atelectasis at the left lung base. Electronically Signed   By: Jasmine Pang M.D.   On: 06/02/2017 00:29   Ct Angio Chest Pe W And/or Wo Contrast  Result Date: 06/02/2017 CLINICAL DATA:  Left-sided rib pain for 1 day EXAM: CT ANGIOGRAPHY CHEST WITH CONTRAST TECHNIQUE: Multidetector CT imaging of the chest was performed using the standard protocol during bolus administration of intravenous contrast. Multiplanar CT image reconstructions and MIPs were obtained to evaluate the vascular anatomy. CONTRAST:  ISOVUE-370 IOPAMIDOL (ISOVUE-370) INJECTION 76% COMPARISON:  Chest x-ray 06/02/2017 FINDINGS: Cardiovascular: Satisfactory opacification of the pulmonary arteries to the segmental level. Filling defects are visualized within right lower lobe segmental and subsegmental pulmonary arteries, for example series 6, image number 129 and series 6, image number 158. Additional filling defects visualized within left lower lobe segmental pulmonary branches as well as  left upper lobe/lingular segmental branches. Nonaneurysmal aorta. Minimal aortic atherosclerosis. Normal heart size. No pericardial effusion Mediastinum/Nodes: Midline trachea. No thyroid mass. Mildly prominent lymph node adjacent to the aortic arch measuring 8 mm. Small distal esophageal hiatal hernia. Lungs/Pleura: Ground-glass densities within the posterior lung bases with focal consolidation at the lateral left lower lobe, findings are suspicious for pulmonary infarct. Upper Abdomen: No acute abnormality. Musculoskeletal: No chest wall abnormality. No acute or significant osseous findings. Review of the MIP images confirms the above  findings. IMPRESSION: 1. Positive for small segmental and subsegmental bilateral lower lobe and lingular pulmonary emboli. Ground-glass densities within the left greater than right lung base with focal consolidation at the lateral aspect of the left lower lobe, findings favor pulmonary infarction as opposed to pneumonia. Critical Value/emergent results were called by telephone at the time of interpretation on 06/02/2017 at 2:59 am to Dr. Azalia BilisKEVIN CAMPOS , who verbally acknowledged these results. Aortic Atherosclerosis (ICD10-I70.0). Electronically Signed   By: Jasmine PangKim  Fujinaga M.D.   On: 06/02/2017 02:59      Assessment/Plan Active Problems:   TOBACCO ABUSE   Elevated blood pressure reading   Seizure disorder (HCC)   Pulmonary embolus (HCC)   History of opioid abuse   Bipolar disorder (HCC)   # Acute bilateral segmental pulmonary embolus - appears to be unprovoked, and with positive family history. Hemodynamically stable, but with small-volume hemoptysis and lots of pleuritic pain with inspiration. Started on heparin ad [facility]. No sign dvt on exam - stop heparin, start elequis - oxycodone for pain - incentive spirometry - outpatient heme f/u  # Elevated blood pressure - denies hx htn. In moderate pain - treat pain as above, continue to monitor BP  # seizure disorder - says is in process of getting "second opinion" from a different neurologist as has had more frequent seizures, most recently a couple of weeks ago - continue home gabapentin, lamictal, dilantin, alprazolam - seizure precautions  # tobacco abuse - nicotine patch  # hx substance abuse - infectious disease w/u  DVT prophylaxis: apixaban as above Code Status: full  Family Communication: father Ryan Fernandez [phone]  Disposition Plan: home  Consults called: none  Admission status: med/surg    Silvano BilisNoah B Ife Vitelli MD [facility] Pager [phone]  If 7PM-7AM, please contact night-coverage [email or web address] Password  [email or web address]  06/02/2017, 8:15 PM

## 2017-06-02 NOTE — ED Provider Notes (Signed)
[facility] EMERGENCY DEPARTMENT Provider Note   CSN: [id number] Arrival date & time: 06/01/17  2335     History   Chief Complaint Chief Complaint  Patient presents with  . Rib Injury    HPI Ryan Fernandez is a 32 y.o. male.  HPI Patient is a 32 year old male who presents to the emergency department with complaints of severe left pleuritic chest pain and left rib pain since last night.  He states that this morning he began coughing and blood was coming up when he coughed.  No fevers or chills.  No recent illness or bronchitis type symptoms.  Denies fevers and chills.  No back pain.  Reports mild shortness of breath.  Painful to take a deep breath on the left side.  No prior history of DVT or pulmonary embolism.  No unilateral leg swelling.  His father did have a pulmonary embolism.   Past Medical History:  Diagnosis Date  . Alcohol abuse   . Opiate addiction (HCC)   . Seizures [facility])     Patient Active Problem List   Diagnosis Date Noted  . TOBACCO ABUSE 08/20/2009  . HYPERTENSION, BORDERLINE 08/20/2009  . SEIZURE DISORDER 08/20/2009  . CHEST PAIN-UNSPECIFIED 08/20/2009  . SYNCOPE, HX OF 08/20/2009    Past Surgical History:  Procedure Laterality Date  . MIDDLE EAR SURGERY         Home Medications    Prior to Admission medications   Medication Sig Start Date End Date Taking? Authorizing Provider  ALPRAZolam (XANAX XR) 2 MG 24 hr tablet Take 2 mg by mouth 2 (two) times daily.    [provider]  ibuprofen (ADVIL,MOTRIN) 200 MG tablet Take 800 mg by mouth every 6 (six) hours as needed (tooth pain).    [provider]  lamoTRIgine (LAMICTAL) 200 MG tablet Take 1 tablet (200 mg total) by mouth daily. 01/31/17   Rise Mu, PA-C  phenytoin (DILANTIN) 100 MG ER capsule Take 3 capsules (300 mg total) by mouth at bedtime. 01/31/17   Rise Mu, PA-C    Family History No family history on file.  Social History Social  History   Tobacco Use  . Smoking status: Current Every Day Smoker    Packs/day: 1.00    Types: Cigarettes  . Smokeless tobacco: Never Used  Substance Use Topics  . Alcohol use: Yes    Comment: occ  . Drug use: Yes    Types: Marijuana     Allergies   Penicillins and Tramadol   Review of Systems Review of Systems  All other systems reviewed and are negative.    Physical Exam Updated Vital Signs BP (!) 143/99 (BP Location: Right Arm)   Pulse (!) 101   Temp 98.3 F (36.8 C) (Oral)   Resp 20   SpO2 96%   Physical Exam  Constitutional: He is oriented to person, place, and time. He appears well-developed and well-nourished.  HENT:  Head: Normocephalic and atraumatic.  Eyes: EOM are normal.  Neck: Normal range of motion.  Cardiovascular: Normal rate, regular rhythm, normal heart sounds and intact distal pulses.  Pulmonary/Chest: Effort normal and breath sounds normal. No respiratory distress.  Abdominal: Soft. He exhibits no distension. There is no tenderness.  Musculoskeletal: Normal range of motion.  Neurological: He is alert and oriented to person, place, and time.  Skin: Skin is warm and dry.  Psychiatric: He has a normal mood and affect. Judgment normal.  Nursing note and vitals reviewed.  ED Treatments / Results  Labs (all labs ordered are listed, but only abnormal results are displayed) Labs Reviewed  CBC - Abnormal; Notable for the following components:      Result Value   WBC 11.4 (*)    All other components within normal limits  BASIC METABOLIC PANEL - Abnormal; Notable for the following components:   Calcium 8.8 (*)    Anion gap 4 (*)    All other components within normal limits    EKG  EKG Interpretation None       Radiology Dg Chest 2 View  Result Date: 06/02/2017 CLINICAL DATA:  Left-sided rib pain EXAM: CHEST  2 VIEW COMPARISON:  03/12/2007 FINDINGS: Subsegmental atelectasis at the left base. No pleural effusion. Normal heart size.  No pneumothorax. Mild wedge compression deformities of the midthoracic spine, new since 2008 radiograph IMPRESSION: Mild subsegmental atelectasis at the left lung base. Electronically Signed   By: Jasmine PangKim  Fujinaga M.D.   On: 06/02/2017 00:29   Ct Angio Chest Pe W And/or Wo Contrast  Result Date: 06/02/2017 CLINICAL DATA:  Left-sided rib pain for 1 day EXAM: CT ANGIOGRAPHY CHEST WITH CONTRAST TECHNIQUE: Multidetector CT imaging of the chest was performed using the standard protocol during bolus administration of intravenous contrast. Multiplanar CT image reconstructions and MIPs were obtained to evaluate the vascular anatomy. CONTRAST:  100mL ISOVUE-370 IOPAMIDOL (ISOVUE-370) INJECTION 76% COMPARISON:  Chest x-ray 06/02/2017 FINDINGS: Cardiovascular: Satisfactory opacification of the pulmonary arteries to the segmental level. Filling defects are visualized within right lower lobe segmental and subsegmental pulmonary arteries, for example series 6, image number 129 and series 6, image number 158. Additional filling defects visualized within left lower lobe segmental pulmonary branches as well as left upper lobe/lingular segmental branches. Nonaneurysmal aorta. Minimal aortic atherosclerosis. Normal heart size. No pericardial effusion Mediastinum/Nodes: Midline trachea. No thyroid mass. Mildly prominent lymph node adjacent to the aortic arch measuring 8 mm. Small distal esophageal hiatal hernia. Lungs/Pleura: Ground-glass densities within the posterior lung bases with focal consolidation at the lateral left lower lobe, findings are suspicious for pulmonary infarct. Upper Abdomen: No acute abnormality. Musculoskeletal: No chest wall abnormality. No acute or significant osseous findings. Review of the MIP images confirms the above findings. IMPRESSION: 1. Positive for small segmental and subsegmental bilateral lower lobe and lingular pulmonary emboli. Ground-glass densities within the left greater than right lung base  with focal consolidation at the lateral aspect of the left lower lobe, findings favor pulmonary infarction as opposed to pneumonia. Critical Value/emergent results were called by telephone at the time of interpretation on 06/02/2017 at 2:59 am to Dr. Azalia BilisKEVIN Mariaeduarda Defranco , who verbally acknowledged these results. Aortic Atherosclerosis (ICD10-I70.0). Electronically Signed   By: Jasmine PangKim  Fujinaga M.D.   On: 06/02/2017 02:59    Procedures .Critical Care Performed by: Azalia Bilisampos, Alexia Dinger, MD Authorized by: Azalia Bilisampos, Nabeel Gladson, MD     CRITICAL CARE Performed by: Azalia BilisKevin Lomax Poehler Total critical care time: 32 minutes Critical care time was exclusive of separately billable procedures and treating other patients. Critical care was necessary to treat or prevent imminent or life-threatening deterioration. Critical care was time spent personally by me on the following activities: development of treatment plan with patient and/or surrogate as well as nursing, discussions with consultants, evaluation of patient's response to treatment, examination of patient, obtaining history from patient or surrogate, ordering and performing treatments and interventions, ordering and review of laboratory studies, ordering and review of radiographic studies, pulse oximetry and re-evaluation of patient's condition.   Medications Ordered  in ED Medications  ketorolac (TORADOL) 30 MG/ML injection 30 mg (30 mg Intravenous Given 06/02/17 0134)  iopamidol (ISOVUE-370) 76 % injection 100 mL (100 mLs Intravenous Contrast Given 06/02/17 0219)  heparin gtt   Initial Impression / Assessment and Plan / ED Course  I have reviewed the triage vital signs and the nursing notes.  Pertinent labs & imaging results that were available during my care of the patient were reviewed by me and considered in my medical decision making (see chart for details).     Patient does have evidence of bilateral subsegmental pulmonary embolism.  Patient will be admitted the  hospital for additional workup.  Initiation of heparin now.pt updated. Will contact hospitalist for admission  Final Clinical Impressions(s) / ED Diagnoses   Final diagnoses:  Other acute pulmonary embolism without acute cor pulmonale [facility])    ED Discharge Orders    None       Azalia Bilis, MD 06/02/17 [phone]

## 2017-06-02 NOTE — Progress Notes (Addendum)
ANTICOAGULATION CONSULT NOTE   Pharmacy Consult for heparin --> Eliquis Indication:new  pulmonary embolus  Allergies  Allergen Reactions  . Penicillins Other (See Comments)    Childhood allergy Has patient had a PCN reaction causing immediate rash, facial/tongue/throat swelling, SOB or lightheadedness with hypotension:Unknown Has patient had a PCN reaction causing severe rash involving mucus membranes or skin necrosis: Unknown Has patient had a PCN reaction that required hospitalization: Unknown: Has patient had a PCN reaction occurring within the last 10 years:NO  If all of the above answers are "NO", then may proceed with Cephalosporin use. .  . Tramadol Rash    Patient Measurements: Height: 5\' 10"  (177.8 cm) Weight: 175 lb 14.8 oz (79.8 kg) IBW/kg (Calculated) : 73 Heparin Dosing Weight:   Vital Signs: Temp: 99.2 F (37.3 C) (01/25 1818) Temp Source: Oral (01/25 1818) BP: 158/108 (01/25 1818) Pulse Rate: 86 (01/25 1818)  Labs: Recent Labs    06/02/17 0126 06/02/17 0954  HGB 13.9  --   HCT 40.9  --   PLT 240  --   LABPROT 11.9  --   INR 0.88  --   HEPARINUNFRC  --  0.96*  CREATININE 1.22  --     Estimated Creatinine Clearance: 90.6 mL/min (by C-G formula based on SCr of 1.22 mg/dL).  Assessment: Patient's a 32 [facility].o [facility] with c/o left pleuritic chest pain and left rib pain as well as hemoptysis.  Chest CTA showed small segmental and subsegmental bilateral lower lobe and lingular pulmonary emboli.  [facility]  To transition patient to Eliquis.  - scr 1.22 (crcl~90) - cbc ok   Plan:  - d/c heparin drip - eliquis 10 mg bid x7 days, then 5 mg bid - monitor for s/s bleeding  Adden: Patient is on phenytoin - a strong CYP3A4 inducer that can increase the clearance of Eliquis.  Per Bodenheimer, change anticoag. from Eliquis to warfarin.  Patient got Eliquis 10 mg dose at ~8PM  tonight. - will give warfarin 5 mg PO x1 tonight (using lower dose since phenytoin can interact with warf) - start lovenox  80 mg SQ q12h to bridge with first dose to be given at 8am on 1/26 per [facility] provider  Mella Inclan P 06/02/2017,7:37 PM

## 2017-06-02 NOTE — Progress Notes (Signed)
ANTICOAGULATION CONSULT NOTE - Initial Consult  Pharmacy Consult for heparin Indication: pulmonary embolus  Allergies  Allergen Reactions  . Penicillins Other (See Comments)    Childhood allergy.  . Tramadol Rash    Patient Measurements: Weight: 175 lb 14.8 oz (79.8 kg) Heparin Dosing Weight: 79.8 kg   Vital Signs: Temp: 98.3 F (36.8 C) (01/24 2345) Temp Source: Oral (01/24 2345) BP: 143/99 (01/24 2345) Pulse Rate: 101 (01/24 2345)  Labs: Recent Labs    06/02/17 0126  HGB 13.9  HCT 40.9  PLT 240  CREATININE 1.22    Estimated Creatinine Clearance: 93.4 mL/min (by C-G formula based on SCr of 1.22 mg/dL).   Medical History: Past Medical History:  Diagnosis Date  . Alcohol abuse   . Opiate addiction (HCC)   . Seizures [facility](HCC)      Assessment: 32 yo male with pleuritic chest pain CTA positive for bilateral PE CBC ok Notes state patient had hemoptysis prior to arrival, I spoke with the RN who states the patient has not had any incidence of this since being at the hospital.  Goal of Therapy:  Heparin level 0.3-0.7 units/ml Monitor platelets by anticoagulation protocol: Yes    Plan:  -Heparin 4700 units x1 then 1300 units/hr -Daily HL, CBC -First level in 6 hours   Baldemar FridayMasters, Imre Vecchione M 06/02/2017,3:03 AM

## 2017-06-02 NOTE — Progress Notes (Signed)
ANTICOAGULATION CONSULT NOTE   Pharmacy Consult for heparin Indication: pulmonary embolus  Allergies  Allergen Reactions  . Penicillins Other (See Comments)    Childhood allergy.  . Tramadol Rash    Patient Measurements: Weight: 175 lb 14.8 oz (79.8 kg) Heparin Dosing Weight: 79.8 kg   Vital Signs: BP: 141/99 (01/25 1300) Pulse Rate: 72 (01/25 1300)  Labs: Recent Labs    06/02/17 0126 06/02/17 0954  HGB 13.9  --   HCT 40.9  --   PLT 240  --   LABPROT 11.9  --   INR 0.88  --   HEPARINUNFRC  --  0.96*  CREATININE 1.22  --     Estimated Creatinine Clearance: 93.4 mL/min (by C-G formula based on SCr of 1.22 mg/dL).   Medical History: Past Medical History:  Diagnosis Date  . Alcohol abuse   . Opiate addiction (HCC)   . Seizures [facility](HCC)      Assessment: 32 yo male with pleuritic chest pain CTA positive for bilateral PE CBC ok Notes state patient had hemoptysis prior to arrival, I spoke with the RN who states the patient has not had any incidence of this since being at the hospital.  Initial heparin level above goal at 0.96. Result delayed d/t heparin level being a [facility]. [facility] and gave orders to reduce rate to 1100/hr. No bleeding or adverse events noted.  Goal of Therapy:  Heparin level 0.3-0.7 units/ml Monitor platelets by anticoagulation protocol: Yes    Plan:  - Reduce heparin to 1100 units/hr -Daily HL, CBC -Recheck heparin level in 6 hours   Severiano GilbertWilson, Myonna Chisom Rhea 06/02/2017,1:46 PM

## 2017-06-02 NOTE — Plan of Care (Signed)
  Safety: Ability to remain free from injury will improve 06/02/2017 1841 - Progressing by William Daltonarpenter, Camile Esters L, RN   Pain Managment: General experience of comfort will improve 06/02/2017 1841 - Progressing by William Daltonarpenter, Gustavia Carie L, RN

## 2017-06-02 NOTE — ED Notes (Signed)
Upon entering room, pt asleep.  Awoke with verbal stimulation.  NAD noted at this time.  VSS

## 2017-06-02 NOTE — ED Notes (Addendum)
Patient transported to CT 

## 2017-06-03 ENCOUNTER — Inpatient Hospital Stay (HOSPITAL_COMMUNITY): Payer: 59

## 2017-06-03 ENCOUNTER — Encounter (HOSPITAL_COMMUNITY): Payer: Self-pay | Admitting: Nurse Practitioner

## 2017-06-03 DIAGNOSIS — F3170 Bipolar disorder, currently in remission, most recent episode unspecified: Secondary | ICD-10-CM | POA: Diagnosis not present

## 2017-06-03 DIAGNOSIS — R030 Elevated blood-pressure reading, without diagnosis of hypertension: Secondary | ICD-10-CM | POA: Diagnosis not present

## 2017-06-03 DIAGNOSIS — G40909 Epilepsy, unspecified, not intractable, without status epilepticus: Secondary | ICD-10-CM | POA: Diagnosis not present

## 2017-06-03 DIAGNOSIS — I2699 Other pulmonary embolism without acute cor pulmonale: Secondary | ICD-10-CM | POA: Diagnosis not present

## 2017-06-03 DIAGNOSIS — R0602 Shortness of breath: Secondary | ICD-10-CM

## 2017-06-03 DIAGNOSIS — Z87898 Personal history of other specified conditions: Secondary | ICD-10-CM | POA: Diagnosis not present

## 2017-06-03 LAB — CBC
HEMATOCRIT: 45.4 % (ref 39.0–52.0)
HEMOGLOBIN: 15.1 g/dL (ref 13.0–17.0)
MCH: 31 pg (ref 26.0–34.0)
MCHC: 33.3 g/dL (ref 30.0–36.0)
MCV: 93.2 fL (ref 78.0–100.0)
Platelets: 273 10*3/uL (ref 150–400)
RBC: 4.87 MIL/uL (ref 4.22–5.81)
RDW: 13.1 % (ref 11.5–15.5)
WBC: 10.3 10*3/uL (ref 4.0–10.5)

## 2017-06-03 LAB — PROTIME-INR
INR: 1.08
Prothrombin Time: 13.9 seconds (ref 11.4–15.2)

## 2017-06-03 LAB — HIV ANTIBODY (ROUTINE TESTING W REFLEX): HIV SCREEN 4TH GENERATION: NONREACTIVE

## 2017-06-03 LAB — HCV AB W REFLEX TO QUANT PCR: HCV Ab: <0.1 s/co ratio (ref 0.0–0.9)

## 2017-06-03 LAB — RPR: RPR Ser Ql: NONREACTIVE

## 2017-06-03 LAB — HEPATITIS B SURFACE ANTIGEN: HEP B S AG: NEGATIVE

## 2017-06-03 LAB — HCV INTERPRETATION

## 2017-06-03 MED ORDER — ZOLPIDEM TARTRATE 5 MG PO TABS
5.0000 mg | ORAL_TABLET | Freq: Once | ORAL | Status: AC
  Administered 2017-06-03: 5 mg via ORAL
  Filled 2017-06-03: qty 1

## 2017-06-03 MED ORDER — COUMADIN BOOK
Freq: Once | Status: AC
  Administered 2017-06-03: 17:00:00
  Filled 2017-06-03: qty 1

## 2017-06-03 MED ORDER — WARFARIN VIDEO
Freq: Once | Status: AC
  Administered 2017-06-04: 13:00:00

## 2017-06-03 MED ORDER — HYDRALAZINE HCL 25 MG PO TABS
25.0000 mg | ORAL_TABLET | Freq: Four times a day (QID) | ORAL | Status: DC | PRN
  Administered 2017-06-03 – 2017-06-04 (×4): 25 mg via ORAL
  Filled 2017-06-03 (×4): qty 1

## 2017-06-03 MED ORDER — IBUPROFEN 800 MG PO TABS
800.0000 mg | ORAL_TABLET | Freq: Three times a day (TID) | ORAL | Status: DC | PRN
  Administered 2017-06-03 (×2): 800 mg via ORAL
  Filled 2017-06-03 (×2): qty 1

## 2017-06-03 MED ORDER — WARFARIN SODIUM 5 MG PO TABS
5.0000 mg | ORAL_TABLET | Freq: Once | ORAL | Status: AC
  Administered 2017-06-03: 5 mg via ORAL
  Filled 2017-06-03: qty 1

## 2017-06-03 MED ORDER — PNEUMOCOCCAL VAC POLYVALENT 25 MCG/0.5ML IJ INJ
0.5000 mL | INJECTION | INTRAMUSCULAR | Status: AC
  Administered 2017-06-04: 0.5 mL via INTRAMUSCULAR
  Filled 2017-06-03: qty 0.5

## 2017-06-03 MED ORDER — INFLUENZA VAC SPLIT QUAD 0.5 ML IM SUSY
0.5000 mL | PREFILLED_SYRINGE | INTRAMUSCULAR | Status: AC
  Administered 2017-06-04: 0.5 mL via INTRAMUSCULAR

## 2017-06-03 MED ORDER — OXYCODONE HCL 5 MG PO TABS
5.0000 mg | ORAL_TABLET | Freq: Once | ORAL | Status: AC
  Administered 2017-06-03: 5 mg via ORAL
  Filled 2017-06-03: qty 1

## 2017-06-03 NOTE — Progress Notes (Addendum)
[facility]  PROGRESS NOTE  Ryan Fernandez ZOX:[id number] DOB: 01/16/1986 DOA: 06/02/2017 PCP: Patient, No Pcp Per   Brief HPI:   32 y.o. male with medical history significant for seizure disorder, history of opioid abuse now in remission, history of alcohol abuse now in remission, tobacco abuse, bipolar disorder, presenting to [facility] today with one day of pleuritic chest pain, cough that developed to include small amounts of blood. Was in usual state of health prior. No history of dvt or pe. But says father has had multiple blood clots. No recent immobilization, surgery, hospitilization. No recent med changes. No fevers. No vomiting or diarrhea. Pain is bilateral and worse with deep breaths and lying flat. Denies injection drug use.     Subjective   Patient seen and examined, complains of chest pain. Patient diagnosed with new onset pulmonary embolism and started on Lovenox. Eliquis was discontinued as it interacts with Dilantin. Patient started on Coumadin.   Assessment/Plan:    1. Acute bilateral segmental pulmonary embolus- CTA chest shows small segmental answers or mental bilateral lower lobe and lingular pulmonary emboli, started on Lovenox and Coumadin. Eliquis discontinued as it interacts with Dilantin. Will check bilateral Venus duplex of lower extremities to rule out DVT. 2. Seizure disorder- continue gabapentin, Lamictal, Dilantin, alprazolam 3. History of opioid use -monitor 4. Elevated blood pressure-blood pressure is consistently elevated, could be from pain. Will monitor in the hospital    DVT prophylaxis: Lovenox  Code Status: full code  Family Communication: No family at bedside  Disposition Plan: likely home once INR is therapeutic   Consultants:  none  Procedures:  none  Continuous infusions . sodium chloride        Antibiotics:   Anti-infectives (From admission, onward)   None       Objective   Vitals:   06/02/17 1818 06/02/17 2136  06/03/17 0519 06/03/17 0739  BP: (!) 158/108 (!) 158/89 (!) 173/111 (!) 154/95  Pulse: 86 86 86   Resp:  20 16   Temp: 99.2 F (37.3 C) 98.7 F (37.1 C) 98.7 F (37.1 C)   TempSrc: Oral Oral Oral   SpO2: 100% 98% 98%   Weight:      Height: 5\' 10"  (1.778 m)       Intake/Output Summary (Last 24 hours) at 06/03/2017 1238 Last data filed at 06/02/2017 2227 Gross per 24 hour  Intake 7.7 ml  Output 300 ml  Net -292.3 ml   Filed Weights   06/02/17 0300  Weight: 79.8 kg (175 lb 14.8 oz)     Physical Examination:   Physical Exam: Eyes: No icterus, extraocular muscles intact  Mouth: Oral mucosa is moist, no lesions on palate,  Neck: Supple, no deformities, masses, or tenderness Lungs: Normal respiratory effort, bilateral clear to auscultation, no crackles or wheezes.  Heart: Regular rate and rhythm, S1 and S2 normal, no murmurs, rubs auscultated Abdomen: BS normoactive,soft,nondistended,non-tender to palpation,no organomegaly Extremities: No pretibial edema, no erythema, no cyanosis, no clubbing Neuro : Alert and oriented to time, place and person, No focal deficits     Data Reviewed: I have personally reviewed following labs and imaging studies  CBG: No results for input(s): GLUCAP in the last 168 hours.  CBC: Recent Labs  Lab 06/02/17 0126 06/03/17 0442  WBC 11.4* 10.3  HGB 13.9 15.1  HCT 40.9 45.4  MCV 92.7 93.2  PLT 240 273    Basic Metabolic Panel: Recent Labs  Lab 06/02/17 0126 06/02/17 1937  NA 135  135  K 4.1 3.7  CL 102 99*  CO2 29 28  GLUCOSE 99 114*  BUN 12 9  CREATININE 1.22 0.95  CALCIUM 8.8* 8.8*    No results found for this or any previous visit (from the past 240 hour(s)).   Liver Function Tests: Recent Labs  Lab 06/02/17 1937  AST 21  ALT 24  ALKPHOS 60  BILITOT 0.8  PROT 7.0  ALBUMIN 3.8      Studies: Dg Chest 2 View  Result Date: 06/02/2017 CLINICAL DATA:  Left-sided rib pain EXAM: CHEST  2 VIEW COMPARISON:   03/12/2007 FINDINGS: Subsegmental atelectasis at the left base. No pleural effusion. Normal heart size. No pneumothorax. Mild wedge compression deformities of the midthoracic spine, new since 2008 radiograph IMPRESSION: Mild subsegmental atelectasis at the left lung base. Electronically Signed   By: Jasmine PangKim  Fujinaga M.D.   On: 06/02/2017 00:29   Ct Angio Chest Pe W And/or Wo Contrast  Result Date: 06/02/2017 CLINICAL DATA:  Left-sided rib pain for 1 day EXAM: CT ANGIOGRAPHY CHEST WITH CONTRAST TECHNIQUE: Multidetector CT imaging of the chest was performed using the standard protocol during bolus administration of intravenous contrast. Multiplanar CT image reconstructions and MIPs were obtained to evaluate the vascular anatomy. CONTRAST:  100mL ISOVUE-370 IOPAMIDOL (ISOVUE-370) INJECTION 76% COMPARISON:  Chest x-ray 06/02/2017 FINDINGS: Cardiovascular: Satisfactory opacification of the pulmonary arteries to the segmental level. Filling defects are visualized within right lower lobe segmental and subsegmental pulmonary arteries, for example series 6, image number 129 and series 6, image number 158. Additional filling defects visualized within left lower lobe segmental pulmonary branches as well as left upper lobe/lingular segmental branches. Nonaneurysmal aorta. Minimal aortic atherosclerosis. Normal heart size. No pericardial effusion Mediastinum/Nodes: Midline trachea. No thyroid mass. Mildly prominent lymph node adjacent to the aortic arch measuring 8 mm. Small distal esophageal hiatal hernia. Lungs/Pleura: Ground-glass densities within the posterior lung bases with focal consolidation at the lateral left lower lobe, findings are suspicious for pulmonary infarct. Upper Abdomen: No acute abnormality. Musculoskeletal: No chest wall abnormality. No acute or significant osseous findings. Review of the MIP images confirms the above findings. IMPRESSION: 1. Positive for small segmental and subsegmental bilateral lower  lobe and lingular pulmonary emboli. Ground-glass densities within the left greater than right lung base with focal consolidation at the lateral aspect of the left lower lobe, findings favor pulmonary infarction as opposed to pneumonia. Critical Value/emergent results were called by telephone at the time of interpretation on 06/02/2017 at 2:59 am to Dr. Azalia BilisKEVIN CAMPOS , who verbally acknowledged these results. Aortic Atherosclerosis (ICD10-I70.0). Electronically Signed   By: Jasmine PangKim  Fujinaga M.D.   On: 06/02/2017 02:59    Scheduled Meds: . ALPRAZolam  2 mg Oral BID  . enoxaparin (LOVENOX) injection  80 mg Subcutaneous Q12H  . gabapentin  800 mg Oral QID  . [START ON 06/04/2017] Influenza vac split quadrivalent PF  0.5 mL Intramuscular Tomorrow-1000  . lamoTRIgine  300 mg Oral Daily  . nicotine  21 mg Transdermal Once  . nicotine  21 mg Transdermal Daily  . phenytoin  300 mg Oral QHS  . [START ON 06/04/2017] pneumococcal 23 valent vaccine  0.5 mL Intramuscular Tomorrow-1000  . sodium chloride flush  3 mL Intravenous Q12H  . Warfarin - Pharmacist Dosing Inpatient   Does not apply q1800      Time spent: 25 min  Meredeth IdeGagan S Delois Tolbert   [facility] Pager [phone]. If 7PM-7AM, please contact night-coverage at [email or web address], Office  [phone]  password [facility]  06/03/2017, 12:38 PM  LOS: 1 day

## 2017-06-03 NOTE — Plan of Care (Signed)
Patient stable 7 a to 7 p shift, continues to c/o pain in his left flank, left lower quadrant rated at a 9, some improvement noted with Percocet and Ibuprofen.  No c/o shortness of breath, just pain and coughing up blood per patient however nurse did not see this during shift.  Medicated for elevated BP x 1.

## 2017-06-03 NOTE — Progress Notes (Signed)
VASCULAR LAB PRELIMINARY  PRELIMINARY  PRELIMINARY  PRELIMINARY  Bilateral lower extremity venous duplex completed.    Preliminary report:  There is no DVT or SVT noted in the bilateral lower extremities. There is sluggish flow noted throughout, bilaterally.   Ryan Fernandez, RVT 06/03/2017, 6:26 PM

## 2017-06-03 NOTE — Progress Notes (Addendum)
ANTICOAGULATION CONSULT NOTE   Pharmacy Consult for Warfarin Indication:new  pulmonary embolus  Allergies  Allergen Reactions  . Penicillins Other (See Comments)    Childhood allergy Has patient had a PCN reaction causing immediate rash, facial/tongue/throat swelling, SOB or lightheadedness with hypotension:Unknown Has patient had a PCN reaction causing severe rash involving mucus membranes or skin necrosis: Unknown Has patient had a PCN reaction that required hospitalization: Unknown: Has patient had a PCN reaction occurring within the last 10 years:NO  If all of the above answers are "NO", then may proceed with Cephalosporin use. .  . Tramadol Rash    Patient Measurements: Height: 5\' 10"  (177.8 cm) Weight: 175 lb 14.8 oz (79.8 kg) IBW/kg (Calculated) : 73  Vital Signs: Temp: 98.7 F (37.1 C) (01/26 0519) Temp Source: Oral (01/26 0519) BP: 154/95 (01/26 0739) Pulse Rate: 86 (01/26 0519)  Labs: Recent Labs    06/02/17 0126 06/02/17 0954 06/02/17 1937 06/03/17 0442 06/03/17 1139  HGB 13.9  --   --  15.1  --   HCT 40.9  --   --  45.4  --   PLT 240  --   --  273  --   LABPROT 11.9  --   --   --  13.9  INR 0.88  --   --   --  1.08  HEPARINUNFRC  --  0.96*  --   --   --   CREATININE 1.22  --  0.95  --   --     Estimated Creatinine Clearance: 116.3 mL/min (by C-G formula based on SCr of 0.95 mg/dL).  Assessment: 32 y/o M [facility] with c/o left pleuritic chest pain and left rib pain as well as hemoptysis.  Chest CTa showed small segmental and subsegmental bilateral lower lobe and lingular pulmonary emboli.  [facility] and [facility]  Patient was transitioned briefly to Eliquis, but due to significant drug interaction with Phenytoin (phenytoin is a strong CYP3A4 inducer that can increase the clearance of Eliquis, thereby reducing efficacy), Eliquis was changed to Lovenox + Warfarin. Phenytoin also  interacts with Warfarin, but can be managed by following and adjusting per INR. Pharmacy consulted to assist with dosing of Warfarin.   Today, 06/03/17:  INR 1.08  CBC: H/H, Pltc WNL  No bleeding issues reported  Drug interactions: Ibuprofen (increased risk of bleeding), phenytoin   Bilateral venous duplex of lower extremities ordered per MD to r/o DVT   Plan:   Warfarin 5mg  PO x 1 this evening  Continue Lovenox 80mg  SQ q12h until minimum 5 day overlap and INR => 2 for two consecutive days  Daily PT/INR  Monitor closely for s/sx of bleeding  Pharmacist to provide warfarin education prior to discharge   Greer PickerelJigna Marceil Welp, PharmD, BCPS Pager: [phone] 06/03/2017 1:15 PM

## 2017-06-03 NOTE — Progress Notes (Signed)
Notified MD regarding patients elevated BP, orders received.

## 2017-06-04 ENCOUNTER — Inpatient Hospital Stay (HOSPITAL_COMMUNITY): Payer: 59

## 2017-06-04 DIAGNOSIS — I361 Nonrheumatic tricuspid (valve) insufficiency: Secondary | ICD-10-CM

## 2017-06-04 DIAGNOSIS — I2699 Other pulmonary embolism without acute cor pulmonale: Secondary | ICD-10-CM | POA: Diagnosis not present

## 2017-06-04 DIAGNOSIS — R030 Elevated blood-pressure reading, without diagnosis of hypertension: Secondary | ICD-10-CM | POA: Diagnosis not present

## 2017-06-04 DIAGNOSIS — F3170 Bipolar disorder, currently in remission, most recent episode unspecified: Secondary | ICD-10-CM | POA: Diagnosis not present

## 2017-06-04 DIAGNOSIS — Z87898 Personal history of other specified conditions: Secondary | ICD-10-CM | POA: Diagnosis not present

## 2017-06-04 LAB — PROTIME-INR
INR: 1.05
PROTHROMBIN TIME: 13.6 s (ref 11.4–15.2)

## 2017-06-04 LAB — ECHOCARDIOGRAM COMPLETE
Height: 70 in
Weight: 2814.83 oz

## 2017-06-04 MED ORDER — ZOLPIDEM TARTRATE 5 MG PO TABS
5.0000 mg | ORAL_TABLET | Freq: Every evening | ORAL | Status: DC | PRN
  Administered 2017-06-04: 5 mg via ORAL
  Filled 2017-06-04: qty 1

## 2017-06-04 MED ORDER — OXYCODONE HCL 5 MG PO TABS
5.0000 mg | ORAL_TABLET | ORAL | Status: DC | PRN
  Administered 2017-06-04 – 2017-06-05 (×7): 5 mg via ORAL
  Filled 2017-06-04 (×7): qty 1

## 2017-06-04 MED ORDER — WARFARIN SODIUM 2.5 MG PO TABS
7.5000 mg | ORAL_TABLET | Freq: Once | ORAL | Status: AC
  Administered 2017-06-04: 13:00:00 7.5 mg via ORAL
  Filled 2017-06-04: qty 1

## 2017-06-04 MED ORDER — OXYCODONE HCL 5 MG PO TABS
10.0000 mg | ORAL_TABLET | Freq: Once | ORAL | Status: AC
  Administered 2017-06-04: 10 mg via ORAL
  Filled 2017-06-04: qty 2

## 2017-06-04 NOTE — Progress Notes (Signed)
ANTICOAGULATION CONSULT NOTE   Pharmacy Consult for Warfarin Indication:new  pulmonary embolus  Allergies  Allergen Reactions  . Penicillins Other (See Comments)    Childhood allergy Has patient had a PCN reaction causing immediate rash, facial/tongue/throat swelling, SOB or lightheadedness with hypotension:Unknown Has patient had a PCN reaction causing severe rash involving mucus membranes or skin necrosis: Unknown Has patient had a PCN reaction that required hospitalization: Unknown: Has patient had a PCN reaction occurring within the last 10 years:NO  If all of the above answers are "NO", then may proceed with Cephalosporin use. .  . Tramadol Rash    Patient Measurements: Height: 5\' 10"  (177.8 cm) Weight: 175 lb 14.8 oz (79.8 kg) IBW/kg (Calculated) : 73  Vital Signs: Temp: 97.7 F (36.5 C) (01/27 0409) Temp Source: Oral (01/27 0409) BP: 151/103 (01/27 0703) Pulse Rate: 68 (01/27 0409)  Labs: Recent Labs    06/02/17 0126 06/02/17 0954 06/02/17 1937 06/03/17 0442 06/03/17 1139 06/04/17 0356  HGB 13.9  --   --  15.1  --   --   HCT 40.9  --   --  45.4  --   --   PLT 240  --   --  273  --   --   LABPROT 11.9  --   --   --  13.9 13.6  INR 0.88  --   --   --  1.08 1.05  HEPARINUNFRC  --  0.96*  --   --   --   --   CREATININE 1.22  --  0.95  --   --   --     Estimated Creatinine Clearance: 116.3 mL/min (by C-G formula based on SCr of 0.95 mg/dL).  Assessment: 32 y/o M [facility] with c/o left pleuritic chest pain and left rib pain as well as hemoptysis.  Chest CTa showed small segmental and subsegmental bilateral lower lobe and lingular pulmonary emboli.  [facility] and [facility]  Patient was transitioned briefly to Eliquis, but due to significant drug interaction with Phenytoin (phenytoin is a strong CYP3A4 inducer that can increase the clearance of Eliquis, thereby reducing efficacy), Eliquis was  changed to Lovenox + Warfarin. Phenytoin also interacts with Warfarin, but can be managed by following and adjusting per INR. Pharmacy consulted to assist with dosing of Warfarin.   Today, 06/04/17:  INR essentially unchanged, 1.05  CBC: H/H, Pltc WNL (1/26)  Yesterday, pt reported coughing up blood, but RN reports none seen. No other bleeding issues noted per nursing.   Drug interactions: Ibuprofen (increased risk of bleeding), phenytoin   Bilateral venous duplex of lower extremities negative for DVT   Plan:   Warfarin 7.5mg  PO x 1 today  Continue Lovenox 80mg  SQ q12h until minimum 5 day overlap and INR => 2 for two consecutive days  Daily PT/INR  Monitor closely for s/sx of bleeding  Pharmacist to provide warfarin education prior to discharge    Greer PickerelJigna Lambros Cerro, PharmD, BCPS Pager: [phone] 06/04/2017 1:04 PM

## 2017-06-04 NOTE — Progress Notes (Signed)
  Echocardiogram 2D Echocardiogram has been performed.  Leta JunglingCooper, Arnett Galindez M 06/04/2017, 11:36 AM

## 2017-06-04 NOTE — Progress Notes (Signed)
[facility]  PROGRESS NOTE  Ryan Fernandez ZOX:[id number] DOB: [birth year] DOA: 06/02/2017 PCP: Patient, No Pcp Per   Brief HPI:   32 y.o. male with medical history significant for seizure disorder, history of opioid abuse now in remission, history of alcohol abuse now in remission, tobacco abuse, bipolar disorder, presenting to [facility] today with one day of pleuritic chestpain, cough that developed to include small amounts of blood. Was in usual state of health prior. No history of dvt or pe. But says father has had multiple blood clots. No recent immobilization, surgery, hospitilization. No recent med changes. No fevers. No vomiting or diarrhea. Pain is bilateral and worse with deep breaths and lying flat. Denies injection drug use.     Subjective   Patient seen and examined, complains of chest pain.    Assessment/Plan:    1. Acute bilateral segmental pulmonary embolus- CTA chest shows small segmental answers or mental bilateral lower lobe and lingular pulmonary emboli, started on Lovenox and Coumadin. Eliquis discontinued as it interacts with Dilantin.  Bilateral Venus duplex of lower extremities was done which is negative for  DVT. Will start oxycodone 5 mg Q4 hours PRN for pain 2. Seizure disorder- continue gabapentin, Lamictal, Dilantin, alprazolam 3. History of opioid use -monitor 4. Elevated blood pressure-blood pressure is consistently elevated, could be from pain. Will monitor in the hospital. Follow echocardiogram    DVT prophylaxis: Lovenox  Code Status: full code  Family Communication: No family at bedside  Disposition Plan: likely home once INR is therapeutic   Consultants:  none  Procedures:  none  Continuous infusions . sodium chloride        Antibiotics:   Anti-infectives (From admission, onward)   None       Objective   Vitals:   06/03/17 1923 06/03/17 2126 06/04/17 0409 06/04/17 0703  BP: (!) 148/90 (!) 163/97 (!) 157/113 (!) 151/103   Pulse:  76 68   Resp:  16 16   Temp:  98.3 F (36.8 C) 97.7 F (36.5 C)   TempSrc:  Oral Oral   SpO2:  97% 96%   Weight:      Height:       No intake or output data in the 24 hours ending 06/04/17 1217 Filed Weights   06/02/17 0300  Weight: 79.8 kg (175 lb 14.8 oz)     Physical Examination:   Physical Exam: Eyes: No icterus, extraocular muscles intact  Mouth: Oral mucosa is moist, no lesions on palate,  Neck: Supple, no deformities, masses, or tenderness Lungs: Normal respiratory effort, bilateral clear to auscultation, no crackles or wheezes.  Heart: Regular rate and rhythm, S1 and S2 normal, no murmurs, rubs auscultated Abdomen: BS normoactive,soft,nondistended,non-tender to palpation,no organomegaly Extremities: No pretibial edema, no erythema, no cyanosis, no clubbing Neuro : Alert and oriented to time, place and person, No focal deficits Skin: No rashes seen on exam     Data Reviewed: I have personally reviewed following labs and imaging studies  CBG: No results for input(s): GLUCAP in the last 168 hours.  CBC: Recent Labs  Lab 06/02/17 0126 06/03/17 0442  WBC 11.4* 10.3  HGB 13.9 15.1  HCT 40.9 45.4  MCV 92.7 93.2  PLT 240 273    Basic Metabolic Panel: Recent Labs  Lab 06/02/17 0126 06/02/17 1937  NA 135 135  K 4.1 3.7  CL 102 99*  CO2 29 28  GLUCOSE 99 114*  BUN 12 9  CREATININE 1.22 0.95  CALCIUM 8.8* 8.8*  No results found for this or any previous visit (from the past 240 hour(s)).   Liver Function Tests: Recent Labs  Lab 06/02/17 1937  AST 21  ALT 24  ALKPHOS 60  BILITOT 0.8  PROT 7.0  ALBUMIN 3.8      Studies: No results found.  Scheduled Meds: . ALPRAZolam  2 mg Oral BID  . enoxaparin (LOVENOX) injection  80 mg Subcutaneous Q12H  . gabapentin  800 mg Oral QID  . lamoTRIgine  300 mg Oral Daily  . nicotine  21 mg Transdermal Daily  . phenytoin  300 mg Oral QHS  . sodium chloride flush  3 mL Intravenous Q12H  .  warfarin   Does not apply Once  . Warfarin - Pharmacist Dosing Inpatient   Does not apply q1800      Time spent: 25 min  Meredeth IdeGagan S Harmonee Tozer   [facility] Pager [phone]. If 7PM-7AM, please contact night-coverage at [email or web address], Office  [phone]  password [email or web address]  06/04/2017, 12:17 PM  LOS: 2 days

## 2017-06-04 NOTE — Plan of Care (Signed)
Patient stable during 7 a to 7 p shift, continues to c/o pain in the left side however better controlled with OxyIr.  Patient watched Coumadin video and has been reviewing Coumadin booklet as well.  Family at bedside for much of shift.  BP remains mildly elevated.

## 2017-06-05 DIAGNOSIS — F3170 Bipolar disorder, currently in remission, most recent episode unspecified: Secondary | ICD-10-CM | POA: Diagnosis not present

## 2017-06-05 DIAGNOSIS — I2699 Other pulmonary embolism without acute cor pulmonale: Secondary | ICD-10-CM | POA: Diagnosis not present

## 2017-06-05 DIAGNOSIS — R030 Elevated blood-pressure reading, without diagnosis of hypertension: Secondary | ICD-10-CM | POA: Diagnosis not present

## 2017-06-05 DIAGNOSIS — G40909 Epilepsy, unspecified, not intractable, without status epilepticus: Secondary | ICD-10-CM | POA: Diagnosis not present

## 2017-06-05 LAB — PROTIME-INR
INR: 1.45
PROTHROMBIN TIME: 17.5 s — AB (ref 11.4–15.2)

## 2017-06-05 MED ORDER — ENOXAPARIN SODIUM 120 MG/0.8ML ~~LOC~~ SOLN: 1.5000 mg/kg | SUBCUTANEOUS | 0 refills | Status: DC

## 2017-06-05 MED ORDER — OXYCODONE HCL 5 MG PO TABS: 5.0000 mg | ORAL_TABLET | Freq: Four times a day (QID) | ORAL | 0 refills | Status: AC | PRN

## 2017-06-05 MED ORDER — ENOXAPARIN SODIUM 120 MG/0.8ML ~~LOC~~ SOLN
1.5000 mg/kg | Freq: Two times a day (BID) | SUBCUTANEOUS | Status: DC
Start: 2017-06-05 — End: 2017-06-05

## 2017-06-05 MED ORDER — WARFARIN SODIUM 7.5 MG PO TABS: 7.5000 mg | ORAL_TABLET | Freq: Once | ORAL | 1 refills | Status: DC

## 2017-06-05 MED ORDER — ENOXAPARIN SODIUM 120 MG/0.8ML ~~LOC~~ SOLN
1.5000 mg/kg | SUBCUTANEOUS | Status: DC
Start: 2017-06-05 — End: 2017-06-05
  Filled 2017-06-05: qty 0.8

## 2017-06-05 MED ORDER — WARFARIN SODIUM 5 MG PO TABS: 7.5000 mg | ORAL_TABLET | Freq: Once | ORAL | Status: DC

## 2017-06-05 MED ORDER — ENOXAPARIN (LOVENOX) PATIENT EDUCATION KIT
PACK | Freq: Once | Status: AC
  Administered 2017-06-05: 10:00:00
  Filled 2017-06-05: qty 1

## 2017-06-05 NOTE — Progress Notes (Signed)
Pt's co-pay for Lovenox is $60.00, pt and MD is aware.

## 2017-06-05 NOTE — Progress Notes (Signed)
Patient given Lovenox Education kit and instructed on how to administer injections. Verbalized understanding. Stated, "I had to give these to my dad." All questions answered.

## 2017-06-05 NOTE — Progress Notes (Addendum)
ANTICOAGULATION CONSULT NOTE   Pharmacy Consult for Warfarin/Lovenox - D3 overlap Indication:new  pulmonary embolus  Allergies  Allergen Reactions  . Penicillins Other (See Comments)    Childhood allergy Has patient had a PCN reaction causing immediate rash, facial/tongue/throat swelling, SOB or lightheadedness with hypotension:Unknown Has patient had a PCN reaction causing severe rash involving mucus membranes or skin necrosis: Unknown Has patient had a PCN reaction that required hospitalization: Unknown: Has patient had a PCN reaction occurring within the last 10 years:NO  If all of the above answers are "NO", then may proceed with Cephalosporin use. .  . Tramadol Rash    Patient Measurements: Height: 5\' 10"  (177.8 cm) Weight: 175 lb 14.8 oz (79.8 kg) IBW/kg (Calculated) : 73  Vital Signs: Temp: 98.2 F (36.8 C) (01/28 0537) Temp Source: Oral (01/28 0537) BP: 140/80 (01/28 0537) Pulse Rate: 88 (01/28 0537)  Labs: Recent Labs    06/02/17 0954 06/02/17 1937 06/03/17 0442 06/03/17 1139 06/04/17 0356 06/05/17 0502  HGB  --   --  15.1  --   --   --   HCT  --   --  45.4  --   --   --   PLT  --   --  273  --   --   --   LABPROT  --   --   --  13.9 13.6 17.5*  INR  --   --   --  1.08 1.05 1.45  HEPARINUNFRC 0.96*  --   --   --   --   --   CREATININE  --  0.95  --   --   --   --     Estimated Creatinine Clearance: 116.3 mL/min (by C-G formula based on SCr of 0.95 mg/dL).  Assessment: 32 [facility] M [facility] with c/o left pleuritic chest pain and left rib pain as well as hemoptysis.  Chest CTa showed small segmental and subsegmental bilateral lower lobe and lingular pulmonary emboli.  [facility]  Patient was transitioned briefly to Eliquis, but due to significant drug interaction with Phenytoin (phenytoin is a strong CYP3A4 inducer that can increase the clearance of Eliquis, thereby reducing  efficacy), Eliquis was changed to Lovenox + Warfarin. Phenytoin also interacts with Warfarin, but can be managed by following and adjusting per INR. Pharmacy consulted to assist with dosing of Warfarin.   Today, 06/05/17:  INR 1.45 starting to respond after 5mg , 5mg  and 7.5mg  warfarin doses  CBC: H/H, Pltc WNL (1/26)  1/26, pt reported coughing up blood, but RN reports none seen. No bleeding noted currently   Drug interactions: Ibuprofen (increased risk of bleeding but last dose given was on 1/26), phenytoin   Bilateral venous duplex of lower extremities negative for DVT   Plan:  1) Repeat warfarin 7.5mg  PO x 1 today 2) Continue Lovenox 80mg  SQ q12h until minimum 5 day overlap and INR => 2 for two consecutive days - NOTE today is Day 3 of overlap so minimum of 2 more days 3) Daily PT/INR 4) Monitor closely for s/sx of bleeding 5) Pharmacist to provide warfarin education prior to discharge 6) Would recommend 7.5mg  daily of warfarin until gets next INR check    Hessie KnowsJustin M Tyliek Timberman, PharmD, BCPS 06/05/2017 8:16 AM

## 2017-06-05 NOTE — Discharge Instructions (Signed)
Get PT/INR checked in 3 days on Thursday at PCP office.

## 2017-06-05 NOTE — Progress Notes (Signed)
Bp at HS was 170/110. Given PRN Hydralazine and recheck an hour later was 157/90.

## 2017-06-05 NOTE — Discharge Summary (Signed)
Physician Discharge Summary  Ryan Fernandez ZOX:[id number] DOB: 04/30/1986 DOA: 06/02/2017  PCP: Patient, No Pcp Per  Admit date: 06/02/2017 Discharge date: 06/05/2017  Time spent: 35* minutes  Recommendations for Outpatient Follow-up:  1. Follow PCP in 3 days to check PT/INR 2. Continue Lovenox and Coumadin together for 3 days   Discharge Diagnoses:  Active Problems:   TOBACCO ABUSE   Elevated blood pressure reading   Seizure disorder (HCC)   Pulmonary embolus (HCC)   History of opioid abuse   Bipolar disorder [facility])   Discharge Condition: Stable  Diet recommendation: Heart healthy diet  Filed Weights   06/02/17 0300  Weight: 79.8 kg (175 lb 14.8 oz)    History of present illness:  32 y.o.malewith medical history significant forseizure disorder, history of opioid abuse now in remission, history of alcohol abuse now in remission, tobacco abuse, bipolar disorder, presenting to [facility] today with one day of pleuritic chestpain, cough that developed to include small amounts of blood. Was in usual state of health prior. No history of dvt or pe. But says father has had multiple blood clots. No recent immobilization, surgery, hospitilization. No recent med changes. No fevers. No vomiting or diarrhea. Pain is bilateral and worse with deep breaths and lying flat. Denies injection drug use.     Hospital Course:  1. Acute bilateral segmental pulmonary embolus- CTA chest shows small segmental bilateral lower lobe and lingular pulmonary emboli, started on Lovenox and Coumadin. Eliquis discontinued as it interacts with Dilantin.  Bilateral Venus duplex of lower extremities was done which is negative for  DVT. Will start oxycodone 5 mg Q4 hours PRN for pain. Will discharge on Lovenox 120 mg sq daily x 3 days, continue coumadin 7.5 mg po daily. Check PT/INR on Thursday  06/08/17 2. Seizure disorder- continue gabapentin, Lamictal, Dilantin, alprazolam 3. History of opioid use  -monitor 4. Elevated blood pressure- improved, likely from pain.     Procedures: Echocardiogram  Consultations: None   Discharge Exam: Vitals:   06/04/17 2233 06/05/17 0537  BP: (!) 155/90 140/80  Pulse: 96 88  Resp:  16  Temp:  98.2 F (36.8 C)  SpO2:  98%    General: Appears in no acute distress Cardiovascular: S1s2 RRR Respiratory: Clear bilaterally  Discharge Instructions    Allergies as of 06/05/2017      Reactions   Penicillins Other (See Comments)   Childhood allergy Has patient had a PCN reaction causing immediate rash, facial/tongue/throat swelling, SOB or lightheadedness with hypotension:Unknown Has patient had a PCN reaction causing severe rash involving mucus membranes or skin necrosis: Unknown Has patient had a PCN reaction that required hospitalization: Unknown: Has patient had a PCN reaction occurring within the last 10 years:NO  If all of the above answers are "NO", then may proceed with Cephalosporin use. .   Tramadol Rash      Medication List    TAKE these medications   ALPRAZolam 2 MG 24 hr tablet Commonly known as:  XANAX XR Take 2 mg by mouth 2 (two) times daily.   enoxaparin 120 MG/0.8ML injection Commonly known as:  LOVENOX Inject 0.8 mLs (120 mg total) into the skin daily.   gabapentin 800 MG tablet Commonly known as:  NEURONTIN Take 800 mg by mouth 4 (four) times daily.   lamoTRIgine 150 MG tablet Commonly known as:  LAMICTAL Take 300 mg by mouth daily. What changed:  Another medication with the same name was removed. Continue taking this medication, and follow the  directions you see here.   ONE-A-DAY MENS PO Take 1 tablet by mouth daily.   oxyCODONE 5 MG immediate release tablet Commonly known as:  ROXICODONE Take 1 tablet (5 mg total) by mouth every 6 (six) hours as needed for up to 5 days.   phenytoin 100 MG ER capsule Commonly known as:  DILANTIN Take 3 capsules (300 mg total) by mouth at bedtime.   warfarin 7.5  MG tablet Commonly known as:  COUMADIN Take 1 tablet (7.5 mg total) by mouth one time only at 6 PM.      Allergies  Allergen Reactions  . Penicillins Other (See Comments)    Childhood allergy Has patient had a PCN reaction causing immediate rash, facial/tongue/throat swelling, SOB or lightheadedness with hypotension:Unknown Has patient had a PCN reaction causing severe rash involving mucus membranes or skin necrosis: Unknown Has patient had a PCN reaction that required hospitalization: Unknown: Has patient had a PCN reaction occurring within the last 10 years:NO  If all of the above answers are "NO", then may proceed with Cephalosporin use. .  . Tramadol Rash      The results of significant diagnostics from this hospitalization (including imaging, microbiology, ancillary and laboratory) are listed below for reference.    Significant Diagnostic Studies: Dg Chest 2 View  Result Date: 06/02/2017 CLINICAL DATA:  Left-sided rib pain EXAM: CHEST  2 VIEW COMPARISON:  03/12/2007 FINDINGS: Subsegmental atelectasis at the left base. No pleural effusion. Normal heart size. No pneumothorax. Mild wedge compression deformities of the midthoracic spine, new since 2008 radiograph IMPRESSION: Mild subsegmental atelectasis at the left lung base. Electronically Signed   By: Jasmine Pang M.D.   On: 06/02/2017 00:29   Ct Angio Chest Pe W And/or Wo Contrast  Result Date: 06/02/2017 CLINICAL DATA:  Left-sided rib pain for 1 day EXAM: CT ANGIOGRAPHY CHEST WITH CONTRAST TECHNIQUE: Multidetector CT imaging of the chest was performed using the standard protocol during bolus administration of intravenous contrast. Multiplanar CT image reconstructions and MIPs were obtained to evaluate the vascular anatomy. CONTRAST:  ISOVUE-370 IOPAMIDOL (ISOVUE-370) INJECTION 76% COMPARISON:  Chest x-ray 06/02/2017 FINDINGS: Cardiovascular: Satisfactory opacification of the pulmonary arteries to the segmental level.  Filling defects are visualized within right lower lobe segmental and subsegmental pulmonary arteries, for example series 6, image number 129 and series 6, image number 158. Additional filling defects visualized within left lower lobe segmental pulmonary branches as well as left upper lobe/lingular segmental branches. Nonaneurysmal aorta. Minimal aortic atherosclerosis. Normal heart size. No pericardial effusion Mediastinum/Nodes: Midline trachea. No thyroid mass. Mildly prominent lymph node adjacent to the aortic arch measuring 8 mm. Small distal esophageal hiatal hernia. Lungs/Pleura: Ground-glass densities within the posterior lung bases with focal consolidation at the lateral left lower lobe, findings are suspicious for pulmonary infarct. Upper Abdomen: No acute abnormality. Musculoskeletal: No chest wall abnormality. No acute or significant osseous findings. Review of the MIP images confirms the above findings. IMPRESSION: 1. Positive for small segmental and subsegmental bilateral lower lobe and lingular pulmonary emboli. Ground-glass densities within the left greater than right lung base with focal consolidation at the lateral aspect of the left lower lobe, findings favor pulmonary infarction as opposed to pneumonia. Critical Value/emergent results were called by telephone at the time of interpretation on 06/02/2017 at 2:59 am to Dr. Azalia Bilis , who verbally acknowledged these results. Aortic Atherosclerosis (ICD10-I70.0). Electronically Signed   By: Jasmine Pang M.D.   On: 06/02/2017 02:59    Microbiology: No results  found for this or any previous visit (from the past 240 hour(s)).   Labs: Basic Metabolic Panel: Recent Labs  Lab 06/02/17 0126 06/02/17 1937  NA 135 135  K 4.1 3.7  CL 102 99*  CO2 29 28  GLUCOSE 99 114*  BUN 12 9  CREATININE 1.22 0.95  CALCIUM 8.8* 8.8*   Liver Function Tests: Recent Labs  Lab 06/02/17 1937  AST 21  ALT 24  ALKPHOS 60  BILITOT 0.8  PROT 7.0   ALBUMIN 3.8   No results for input(s): LIPASE, AMYLASE in the last 168 hours. No results for input(s): AMMONIA in the last 168 hours. CBC: Recent Labs  Lab 06/02/17 0126 06/03/17 0442  WBC 11.4* 10.3  HGB 13.9 15.1  HCT 40.9 45.4  MCV 92.7 93.2  PLT 240 273       Signed:  Meredeth IdeGagan S Deari Sessler MD.  [facility] 06/05/2017, 11:05 AM

## 2017-06-08 DIAGNOSIS — I10 Essential (primary) hypertension: Secondary | ICD-10-CM

## 2017-06-08 HISTORY — DX: Essential (primary) hypertension: I10

## 2017-06-22 ENCOUNTER — Telehealth: Payer: Self-pay | Admitting: Neurology

## 2017-06-22 ENCOUNTER — Ambulatory Visit: Payer: 59 | Admitting: Neurology

## 2017-06-22 NOTE — Telephone Encounter (Signed)
This patient cancelled the same day of a NP appointment. 

## 2017-06-23 ENCOUNTER — Encounter: Payer: Self-pay | Admitting: Neurology

## 2017-06-25 ENCOUNTER — Other Ambulatory Visit: Payer: Self-pay

## 2017-06-25 ENCOUNTER — Emergency Department (HOSPITAL_BASED_OUTPATIENT_CLINIC_OR_DEPARTMENT_OTHER): Payer: 59

## 2017-06-25 ENCOUNTER — Encounter (HOSPITAL_BASED_OUTPATIENT_CLINIC_OR_DEPARTMENT_OTHER): Payer: Self-pay | Admitting: *Deleted

## 2017-06-25 ENCOUNTER — Emergency Department (HOSPITAL_BASED_OUTPATIENT_CLINIC_OR_DEPARTMENT_OTHER)
Admission: EM | Admit: 2017-06-25 | Discharge: 2017-06-25 | Discharge status: Against Medical Advice | Payer: 59 | Attending: Emergency Medicine | Admitting: Emergency Medicine

## 2017-06-25 DIAGNOSIS — Z79899 Other long term (current) drug therapy: Secondary | ICD-10-CM | POA: Diagnosis not present

## 2017-06-25 DIAGNOSIS — R079 Chest pain, unspecified: Secondary | ICD-10-CM | POA: Insufficient documentation

## 2017-06-25 DIAGNOSIS — R791 Abnormal coagulation profile: Secondary | ICD-10-CM | POA: Diagnosis not present

## 2017-06-25 DIAGNOSIS — F17210 Nicotine dependence, cigarettes, uncomplicated: Secondary | ICD-10-CM | POA: Insufficient documentation

## 2017-06-25 DIAGNOSIS — Z7901 Long term (current) use of anticoagulants: Secondary | ICD-10-CM | POA: Diagnosis not present

## 2017-06-25 HISTORY — DX: Other pulmonary embolism without acute cor pulmonale: I26.99

## 2017-06-25 LAB — BASIC METABOLIC PANEL
Anion gap: 7 (ref 5–15)
BUN: 10 mg/dL (ref 6–20)
CALCIUM: 9.2 mg/dL (ref 8.9–10.3)
CO2: 27 mmol/L (ref 22–32)
CREATININE: 0.9 mg/dL (ref 0.61–1.24)
Chloride: 103 mmol/L (ref 101–111)
GFR calc non Af Amer: >60 mL/min (ref >60)
Glucose, Bld: 110 mg/dL — ABNORMAL HIGH (ref 65–99)
Potassium: 4.8 mmol/L (ref 3.5–5.1)
SODIUM: 137 mmol/L (ref 135–145)

## 2017-06-25 LAB — CBC
HCT: 44.7 % (ref 39.0–52.0)
Hemoglobin: 15.1 g/dL (ref 13.0–17.0)
MCH: 30.4 pg (ref 26.0–34.0)
MCHC: 33.8 g/dL (ref 30.0–36.0)
MCV: 90.1 fL (ref 78.0–100.0)
PLATELETS: 322 10*3/uL (ref 150–400)
RBC: 4.96 MIL/uL (ref 4.22–5.81)
RDW: 12.9 % (ref 11.5–15.5)
WBC: 7 10*3/uL (ref 4.0–10.5)

## 2017-06-25 LAB — TROPONIN I: Troponin I: <0.03 ng/mL (ref <0.03)

## 2017-06-25 LAB — PROTIME-INR
INR: 5.17
Prothrombin Time: 47.3 seconds — ABNORMAL HIGH (ref 11.4–15.2)

## 2017-06-25 MED ORDER — PHYTONADIONE 5 MG PO TABS
2.5000 mg | ORAL_TABLET | Freq: Once | ORAL | Status: AC
  Administered 2017-06-25: 2.5 mg via ORAL
  Filled 2017-06-25: qty 1

## 2017-06-25 MED ORDER — FENTANYL CITRATE (PF) 100 MCG/2ML IJ SOLN
100.0000 ug | Freq: Once | INTRAMUSCULAR | Status: AC
  Administered 2017-06-25: 100 ug via INTRAVENOUS
  Filled 2017-06-25: qty 2

## 2017-06-25 MED ORDER — IOPAMIDOL (ISOVUE-370) INJECTION 76%
100.0000 mL | Freq: Once | INTRAVENOUS | Status: AC | PRN
  Administered 2017-06-25: 100 mL via INTRAVENOUS

## 2017-06-25 NOTE — ED Notes (Signed)
ED Provider at bedside. 

## 2017-06-25 NOTE — ED Triage Notes (Addendum)
Pt reports increasing chest pain, right side, pain started last night but is severe this morning. He is on warfarin for PE. Reports coughing up sputum streaked with blood

## 2017-06-25 NOTE — ED Notes (Signed)
Date and time results received: 06/25/17 1644 (use smartphrase ".now" to insert current time)  Test: PT INR Critical Value: 47.3 5.17  Name of Provider Notified: goldston Orders Received? Or Actions Taken?:

## 2017-06-25 NOTE — ED Notes (Signed)
Pt given d/c instructions as per chart. Verbalizes understanding. No questions. States he does not wish to stay, but will follow up with PCP tomorrow.

## 2017-06-25 NOTE — ED Provider Notes (Signed)
[facility] EMERGENCY DEPARTMENT Provider Note   CSN: [id number] Arrival date & time: 06/25/17  1403     History   Chief Complaint Chief Complaint  Patient presents with  . Hemoptysis  . Chest Pain    HPI Federico FlakeJustin P Mcglinchey is a 32 y.o. male.  HPI  32 year old male with a recent diagnosis of pulmonary embolism currently on warfarin presents with right-sided chest pain.  States the chest pain woke him up around 3 or 4 AM and has been constant since.  It is best if he is standing straight up, otherwise present with any type of movement or certain positions.  It is worse with inspiration.  There is no shortness of breath, only pain with it.  It is in the same areas when he was diagnosed with a PE at the end of last month.  He has had some cough starting last night with some mild blood in it.  He denies a large amount of blood.  No fevers or abdominal pain.  The pain in his chest seems to wrap around to the right.  It is intense and occasionally sharp/stabbing. Taken tylenol with no relief. Reports compliance with warfarin. No leg swelling/leg pain.  Past Medical History:  Diagnosis Date  . Alcohol abuse   . Opiate addiction (HCC)   . PE (pulmonary thromboembolism) (HCC)   . Seizures [facility](HCC)     Patient Active Problem List   Diagnosis Date Noted  . Pulmonary embolus (HCC) 06/02/2017  . History of opioid abuse 06/02/2017  . Bipolar disorder (HCC) 06/02/2017  . TOBACCO ABUSE 08/20/2009  . Elevated blood pressure reading 08/20/2009  . Seizure disorder (HCC) 08/20/2009  . CHEST PAIN-UNSPECIFIED 08/20/2009  . SYNCOPE, HX OF 08/20/2009    Past Surgical History:  Procedure Laterality Date  . MIDDLE EAR SURGERY         Home Medications    Prior to Admission medications   Medication Sig Start Date End Date Taking? Authorizing Provider  ALPRAZolam (XANAX XR) 2 MG 24 hr tablet Take 2 mg by mouth 2 (two) times daily.   Yes [provider]  gabapentin (NEURONTIN)  800 MG tablet Take 800 mg by mouth 4 (four) times daily. 05/29/17  Yes [provider]  lamoTRIgine (LAMICTAL) 150 MG tablet Take 300 mg by mouth daily. 05/26/17  Yes [provider]  Multiple Vitamin (ONE-A-DAY MENS PO) Take 1 tablet by mouth daily.   Yes [provider]  phenytoin (DILANTIN) 100 MG ER capsule Take 3 capsules (300 mg total) by mouth at bedtime. 01/31/17  Yes Rise MuLeaphart, Kenneth T, PA-C  warfarin (COUMADIN) 7.5 MG tablet Take 1 tablet (7.5 mg total) by mouth one time only at 6 PM. 06/05/17  Yes Sharl MaLama, Sarina IllGagan S, MD  enoxaparin (LOVENOX) 120 MG/0.8ML injection Inject 0.8 mLs (120 mg total) into the skin daily. 06/05/17   Meredeth IdeLama, Gagan S, MD    Family History No family history on file.  Social History Social History   Tobacco Use  . Smoking status: Current Every Day Smoker    Packs/day: 1.00    Types: Cigarettes  . Smokeless tobacco: Current User    Types: Snuff  Substance Use Topics  . Alcohol use: Yes    Comment: occ  . Drug use: Yes    Types: Marijuana     Allergies   Penicillins; Tramadol; and Vicodin [hydrocodone-acetaminophen]   Review of Systems Review of Systems  Constitutional: Negative for fever.  Respiratory: Positive for cough.  Negative for shortness of breath.   Cardiovascular: Positive for chest pain. Negative for leg swelling.  Gastrointestinal: Negative for abdominal pain.  Musculoskeletal: Positive for back pain.  All other systems reviewed and are negative.    Physical Exam Updated Vital Signs BP (!) 134/100 (BP Location: Right Arm) Comment: Measured x2  Pulse 76   Temp 98.4 F (36.9 C) (Oral)   Resp 16   Ht 5\' 10"  (1.778 m)   Wt 79.4 kg (175 lb)   SpO2 100%   BMI 25.11 kg/m   Physical Exam  Constitutional: He is oriented to person, place, and time. He appears well-developed and well-nourished.  Non-toxic appearance. He does not appear ill.  HENT:  Head: Normocephalic and atraumatic.  Right Ear: External ear  normal.  Left Ear: External ear normal.  Nose: Nose normal.  Eyes: Right eye exhibits no discharge. Left eye exhibits no discharge.  Neck: Neck supple.  Cardiovascular: Normal rate, regular rhythm and normal heart sounds.  Pulses:      Radial pulses are 2+ on the right side, and 2+ on the left side.  Pulmonary/Chest: Effort normal and breath sounds normal. No accessory muscle usage. No tachypnea. He has no decreased breath sounds. He exhibits tenderness.    Abdominal: Soft. There is no tenderness.  Musculoskeletal: He exhibits no edema.       Thoracic back: He exhibits tenderness.       Back:  Neurological: He is alert and oriented to person, place, and time.  Skin: Skin is warm and dry.  Nursing note and vitals reviewed.    ED Treatments / Results  Labs (all labs ordered are listed, but only abnormal results are displayed) Labs Reviewed  BASIC METABOLIC PANEL - Abnormal; Notable for the following components:      Result Value   Glucose, Bld 110 (*)    All other components within normal limits  PROTIME-INR - Abnormal; Notable for the following components:   Prothrombin Time 47.3 (*)    INR 5.17 (*)    All other components within normal limits  CBC  TROPONIN I    EKG  EKG Interpretation  Date/Time:  Sunday June 25 2017 14:59:25 EST Ventricular Rate:  81 PR Interval:    QRS Duration: 93 QT Interval:  378 QTC Calculation: 439 R Axis:   82 Text Interpretation:  Sinus rhythm no acute ST/T changes no significant change since 2016 Confirmed by Pricilla Loveless [phone]) on 06/25/2017 3:14:47 PM       Radiology Dg Chest 2 View  Result Date: 06/25/2017 CLINICAL DATA:  Right-sided pain. EXAM: CHEST  2 VIEW COMPARISON:  June 02, 2017 FINDINGS: Mild wedge deformities of midthoracic vertebral bodies are unchanged since the CT scan from June 02, 2017. The heart, hila, mediastinum, lungs, and pleura are unremarkable. IMPRESSION: No active cardiopulmonary disease.  Electronically Signed   By: Gerome Sam III M.D   On: 06/25/2017 15:34   Ct Angio Chest Pe W/cm &/or Wo Cm  Result Date: 06/25/2017 CLINICAL DATA:  Right-sided chest pain and shortness of breath. Diagnosed with pulmonary emboli last month. EXAM: CT ANGIOGRAPHY CHEST WITH CONTRAST TECHNIQUE: Multidetector CT imaging of the chest was performed using the standard protocol during bolus administration of intravenous contrast. Multiplanar CT image reconstructions and MIPs were obtained to evaluate the vascular anatomy. CONTRAST:  ISOVUE-370 IOPAMIDOL (ISOVUE-370) INJECTION 76% COMPARISON:  06/02/2017 FINDINGS: Cardiovascular: Pulmonary arterial opacification is adequate. Segmental and subsegmental emboli on the prior study have nearly completely resolved  in the interval. No residual segmental emboli are identified. There is likely minimal residual subsegmental embolus in the right lower lobe (series 6, image 174). No new segmental or more central emboli are identified. The thoracic aorta is normal in caliber. The heart is normal in size. There is no pericardial effusion. Mediastinum/Nodes: No enlarged axillary, mediastinal, or hilar lymph nodes. Small sliding hiatal hernia. Lungs/Pleura: No pleural effusion or pneumothorax. Mild respiratory motion artifact. Decreased focal consolidation laterally in the left lung base in the lower lobe, now measuring 1.9 x 1.2 cm (series 7, image 67). Upper Abdomen: No acute abnormality. Musculoskeletal: No acute osseous abnormality or suspicious osseous lesion. Unchanged mild T8 superior endplate compression fracture and small upper thoracic Schmorl's nodes. Review of the MIP images confirms the above findings. IMPRESSION: 1. Near complete resolution of pulmonary emboli from last month's study. No evidence of new emboli. 2. Decreased focal consolidation in the basilar left lower lobe which may reflect resolving infarct. Electronically Signed   By: Sebastian Ache M.D.   On:  06/25/2017 17:11    Procedures Procedures (including critical care time)  Medications Ordered in ED Medications  fentaNYL (SUBLIMAZE) injection 100 mcg (100 mcg Intravenous Given 06/25/17 1546)  iopamidol (ISOVUE-370) 76 % injection 100 mL (100 mLs Intravenous Contrast Given 06/25/17 1629)  phytonadione (VITAMIN K) tablet 2.5 mg (2.5 mg Oral Given 06/25/17 1717)     Initial Impression / Assessment and Plan / ED Course  I have reviewed the triage vital signs and the nursing notes.  Pertinent labs & imaging results that were available during my care of the patient were reviewed by me and considered in my medical decision making (see chart for details).     Patient continues to have intermittent small volume hemoptysis.  CT was obtained but does not show pulmonary embolism but instead shows resolution of PE and possible resolving infarct.  This may be contributing to his pain.  With his supratherapeutic INR of over 5, I recommended admission to total for monitoring as he could develop severe hemoptysis.  However he declines and wants to be discharged.  His hemoglobin is stable and he does not appear in distress.  I have given him oral vitamin K and recommend he hold his warfarin tonight and follow-up at his doctor tomorrow for further treatment of his supratherapeutic INR.  He agrees.  He understands the risk of massive bleeding and death with his coagulopathy.  He will leave AGAINST MEDICAL ADVICE.  He appears well appears to have medical decision making capacity.  Final Clinical Impressions(s) / ED Diagnoses   Final diagnoses:  Supratherapeutic INR  Right-sided chest pain    ED Discharge Orders    None       Pricilla Loveless, MD 06/25/17 2357

## 2017-06-25 NOTE — Discharge Instructions (Signed)
Your INR is 5.1 today. This is much too elevated.  You are at risk for bleeding, including from any type of cut or injury as well as from your lungs were you are coughing up blood.  If you develop any bleeding or worsening bleeding from your lungs or bleed from other locations or severe headache, return to the ER or call 911 immediately.  You were given 2.5 mg of vitamin K today.  Do not take your warfarin tonight.  Call or see your doctor that manages your Coumadin/warfarin tomorrow for recommendations on how to manage your blood thinner from there.

## 2017-06-26 ENCOUNTER — Encounter: Payer: Self-pay | Admitting: Neurology

## 2017-06-26 ENCOUNTER — Encounter (INDEPENDENT_AMBULATORY_CARE_PROVIDER_SITE_OTHER): Payer: Self-pay

## 2017-06-26 ENCOUNTER — Ambulatory Visit: Payer: 59 | Admitting: Neurology

## 2017-06-26 VITALS — BP 141/88 | HR 87 | Ht 70.0 in | Wt 186.5 lb

## 2017-06-26 DIAGNOSIS — G40909 Epilepsy, unspecified, not intractable, without status epilepticus: Secondary | ICD-10-CM

## 2017-06-26 DIAGNOSIS — Z5181 Encounter for therapeutic drug level monitoring: Secondary | ICD-10-CM

## 2017-06-26 DIAGNOSIS — F5104 Psychophysiologic insomnia: Secondary | ICD-10-CM

## 2017-06-26 HISTORY — DX: Psychophysiologic insomnia: F51.04

## 2017-06-26 MED ORDER — TRAZODONE HCL 150 MG PO TABS: 150.0000 mg | ORAL_TABLET | Freq: Every day | ORAL | 3 refills | Status: DC

## 2017-06-26 MED ORDER — ALPRAZOLAM 0.5 MG PO TABS: ORAL_TABLET | ORAL | 0 refills | Status: DC
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
## 2017-06-26 NOTE — Patient Instructions (Addendum)
   We will get MRI of the brain and an EEG.   We will try Trazodone at night for sleep.

## 2017-06-27 ENCOUNTER — Telehealth: Payer: Self-pay | Admitting: Neurology

## 2017-06-27 LAB — COMPREHENSIVE METABOLIC PANEL
ALT: 25 IU/L (ref 0–44)
AST: 23 IU/L (ref 0–40)
Albumin/Globulin Ratio: 1.9 (ref 1.2–2.2)
Albumin: 4.4 g/dL (ref 3.5–5.5)
Alkaline Phosphatase: 77 IU/L (ref 39–117)
BILIRUBIN TOTAL: 0.3 mg/dL (ref 0.0–1.2)
BUN/Creatinine Ratio: 8 — ABNORMAL LOW (ref 9–20)
BUN: 8 mg/dL (ref 6–20)
CALCIUM: 9.4 mg/dL (ref 8.7–10.2)
CHLORIDE: 102 mmol/L (ref 96–106)
CO2: 23 mmol/L (ref 20–29)
CREATININE: 1 mg/dL (ref 0.76–1.27)
GFR calc Af Amer: 115 mL/min/{1.73_m2} (ref >59)
GFR calc non Af Amer: 99 mL/min/{1.73_m2} (ref >59)
GLUCOSE: 97 mg/dL (ref 65–99)
Globulin, Total: 2.3 g/dL (ref 1.5–4.5)
Potassium: 4.9 mmol/L (ref 3.5–5.2)
Sodium: 139 mmol/L (ref 134–144)
Total Protein: 6.7 g/dL (ref 6.0–8.5)

## 2017-06-27 LAB — CBC WITH DIFFERENTIAL/PLATELET
BASOS ABS: 0.1 10*3/uL (ref 0.0–0.2)
Basos: 1 %
EOS (ABSOLUTE): 0.1 10*3/uL (ref 0.0–0.4)
EOS: 2 %
HEMOGLOBIN: 14.4 g/dL (ref 13.0–17.7)
Hematocrit: 42.2 % (ref 37.5–51.0)
IMMATURE GRANS (ABS): 0 10*3/uL (ref 0.0–0.1)
Immature Granulocytes: 0 %
LYMPHS: 29 %
Lymphocytes Absolute: 2 10*3/uL (ref 0.7–3.1)
MCH: 30.4 pg (ref 26.6–33.0)
MCHC: 34.1 g/dL (ref 31.5–35.7)
MCV: 89 fL (ref 79–97)
MONOCYTES: 9 %
Monocytes Absolute: 0.6 10*3/uL (ref 0.1–0.9)
NEUTROS ABS: 4.2 10*3/uL (ref 1.4–7.0)
Neutrophils: 59 %
Platelets: 324 10*3/uL (ref 150–379)
RBC: 4.74 x10E6/uL (ref 4.14–5.80)
RDW: 13.4 % (ref 12.3–15.4)
WBC: 7.1 10*3/uL (ref 3.4–10.8)

## 2017-06-27 LAB — PHENYTOIN LEVEL, TOTAL: PHENYTOIN (DILANTIN), SERUM: 16.7 ug/mL (ref 10.0–20.0)

## 2017-06-27 LAB — LAMOTRIGINE LEVEL: Lamotrigine Lvl: 2.3 ug/mL (ref 2.0–20.0)

## 2017-06-27 MED ORDER — PHENYTOIN SODIUM EXTENDED 100 MG PO CAPS: 400.0000 mg | ORAL_CAPSULE | Freq: Every day | ORAL | Status: DC

## 2017-06-27 MED ORDER — LAMOTRIGINE 25 MG PO TABS: ORAL_TABLET | ORAL | 1 refills | Status: DC

## 2017-06-27 NOTE — Telephone Encounter (Signed)
I called the patient.  The Dilantin level is therapeutic, he is to remain on 400 mg daily.  The lamotrigine level is in the low therapeutic range, we will gradually increase the dose by 50 mg every 2 weeks until he gets to 150 mg in the morning and 300 mg in the evening.  He will call after 6 weeks and I will call in a prescription for the 150 mg tablets.

## 2017-07-04 ENCOUNTER — Other Ambulatory Visit: Payer: Self-pay | Admitting: Neurology

## 2017-07-04 ENCOUNTER — Ambulatory Visit
Admission: RE | Admit: 2017-07-04 | Discharge: 2017-07-04 | Disposition: A | Discharge status: Home / Self Care | Payer: 59 | Source: Ambulatory Visit | Attending: Neurology | Admitting: Neurology

## 2017-07-04 ENCOUNTER — Other Ambulatory Visit: Payer: 59

## 2017-07-04 DIAGNOSIS — G40909 Epilepsy, unspecified, not intractable, without status epilepticus: Secondary | ICD-10-CM

## 2017-07-05 ENCOUNTER — Ambulatory Visit: Payer: 59

## 2017-07-05 ENCOUNTER — Encounter (INDEPENDENT_AMBULATORY_CARE_PROVIDER_SITE_OTHER): Payer: Self-pay

## 2017-07-05 DIAGNOSIS — G40909 Epilepsy, unspecified, not intractable, without status epilepticus: Secondary | ICD-10-CM | POA: Diagnosis not present

## 2017-07-05 MED ORDER — GADOPENTETATE DIMEGLUMINE 469.01 MG/ML IV SOLN: 15.0000 mL | Freq: Once | INTRAVENOUS | Status: AC | PRN

## 2017-07-07 ENCOUNTER — Telehealth: Payer: Self-pay | Admitting: Neurology

## 2017-07-07 NOTE — Telephone Encounter (Signed)
I called the patient.  The MRI of the brain is normal.  EEG study is still pending.   MRI brain 07/07/17:  IMPRESSION:  Unremarkable MRI scan of the brain with and without contrast.

## 2017-07-10 ENCOUNTER — Other Ambulatory Visit: Payer: 59

## 2017-07-20 ENCOUNTER — Telehealth: Payer: Self-pay | Admitting: Neurology

## 2017-07-20 ENCOUNTER — Ambulatory Visit: Payer: 59 | Admitting: Neurology

## 2017-07-20 DIAGNOSIS — G40909 Epilepsy, unspecified, not intractable, without status epilepticus: Secondary | ICD-10-CM | POA: Diagnosis not present

## 2017-07-20 DIAGNOSIS — Z5181 Encounter for therapeutic drug level monitoring: Secondary | ICD-10-CM

## 2017-07-20 NOTE — Telephone Encounter (Signed)
Patient paged the on-call doctor tonight. Said he was having the "same ol symptoms", slurred speech, "can hardly walk". He stated compliance with his medical regimen and not taking any non-prescription substances. No fevers or illness or injury, no alteration of awareness. I advised him to call 911 multiple times. He declined, said there are too many sick people there and he didn't feel like going. His speech was slightly slurred but he answered questions, appeared to have good comprehension during the conversation. He said he just wanted Dr. Anne HahnWillis to know. Informed him will pass along the message in the morning to Dr. Anne HahnWillis and again advised him to call 911 especially since this could be due to toxicity from dilantin which could be very dangerous and he should have blood work and be monitored in the ED as can cause coma or even death, his described ataxia is also dangerous for falls and injuries.

## 2017-07-20 NOTE — Telephone Encounter (Signed)
I called the patient.  The unremarkable.  The patient claims that over the last several days he has had problems with getting his legs to move properly, he has some slurring of speech, feels off balance.  The description sounds as if he may be toxic on Dilantin.  We will check Dilantin Lamictal levels tomorrow.

## 2017-07-20 NOTE — Procedures (Signed)
    History:  Ryan BlossomJustin Fernandez is a 32 year old gentleman with a history of opiate and alcohol addiction.  The patient has had several seizure events in the past, the last such events occurred around 07 May 2017.  The patient has a sensation of an odor before the onset of the blackout and generalized jerking that may be associated with tongue biting.  He is being evaluated for the seizures.  This is a routine EEG.  No skull defects are noted.  Medications include alprazolam, Lamictal, Lopressor, Dilantin, Coumadin, alprazolam, and trazodone.  EEG classification: Normal awake  Description of the recording: The background rhythms of this recording consists of a fairly well modulated medium amplitude alpha rhythm of 9 Hz that is reactive to eye opening and closure. As the record progresses, the patient appears to remain in the waking state throughout the recording. Photic stimulation was performed, resulting in a bilateral and symmetric photic driving response. Hyperventilation was also performed, resulting in a minimal buildup of the background rhythm activities without significant slowing seen. At no time during the recording does there appear to be evidence of spike or spike wave discharges or evidence of focal slowing. EKG monitor shows no evidence of cardiac rhythm abnormalities with a heart rate of 90.  Impression: This is a normal EEG recording in the waking state. No evidence of ictal or interictal discharges are seen.

## 2017-07-24 NOTE — Telephone Encounter (Signed)
Events noted.  We will be checking a Dilantin level, if this is not elevated, will get MRI of the brain.

## 2017-08-18 ENCOUNTER — Other Ambulatory Visit: Payer: Self-pay | Admitting: Neurology

## 2017-08-18 ENCOUNTER — Telehealth: Payer: Self-pay | Admitting: Neurology

## 2017-08-18 MED ORDER — LAMOTRIGINE 150 MG PO TABS: ORAL_TABLET | ORAL | 1 refills | Status: DC

## 2017-08-18 NOTE — Telephone Encounter (Signed)
I called the patient.  The patient is to go on Lamictal 150 mg tablets, 1 in the morning and 2 in the evening.  He has not yet gotten his blood work checked, he is to come and get his blood work.

## 2017-08-18 NOTE — Telephone Encounter (Signed)
It does not appear that pt had his dilantin or lamotrigine levels checked as ordered in March. Will forward to Dr. Anne HahnWillis to consider refilling lamictal.

## 2017-08-21 ENCOUNTER — Other Ambulatory Visit: Payer: Self-pay | Admitting: Neurology
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
## 2017-09-26 ENCOUNTER — Ambulatory Visit: Payer: 59 | Admitting: Nurse Practitioner

## 2017-09-26 ENCOUNTER — Telehealth: Payer: Self-pay | Admitting: *Deleted

## 2017-09-26 NOTE — Telephone Encounter (Signed)
Patient was no show for follow up with NP today.  

## 2017-09-27 ENCOUNTER — Encounter: Payer: Self-pay | Admitting: Nurse Practitioner

## 2018-03-20 ENCOUNTER — Encounter (HOSPITAL_COMMUNITY): Payer: Self-pay | Admitting: Emergency Medicine

## 2018-03-20 ENCOUNTER — Emergency Department (HOSPITAL_COMMUNITY): Payer: Self-pay

## 2018-03-20 ENCOUNTER — Other Ambulatory Visit: Payer: Self-pay

## 2018-03-20 ENCOUNTER — Emergency Department (HOSPITAL_COMMUNITY)
Admission: EM | Admit: 2018-03-20 | Discharge: 2018-03-20 | Disposition: A | Discharge status: Home / Self Care | Payer: Self-pay | Attending: Emergency Medicine | Admitting: Emergency Medicine

## 2018-03-20 DIAGNOSIS — Y929 Unspecified place or not applicable: Secondary | ICD-10-CM | POA: Insufficient documentation

## 2018-03-20 DIAGNOSIS — Y998 Other external cause status: Secondary | ICD-10-CM | POA: Insufficient documentation

## 2018-03-20 DIAGNOSIS — F319 Bipolar disorder, unspecified: Secondary | ICD-10-CM | POA: Insufficient documentation

## 2018-03-20 DIAGNOSIS — Z9114 Patient's other noncompliance with medication regimen: Secondary | ICD-10-CM

## 2018-03-20 DIAGNOSIS — F1120 Opioid dependence, uncomplicated: Secondary | ICD-10-CM | POA: Insufficient documentation

## 2018-03-20 DIAGNOSIS — Z79899 Other long term (current) drug therapy: Secondary | ICD-10-CM | POA: Insufficient documentation

## 2018-03-20 DIAGNOSIS — Z9119 Patient's noncompliance with other medical treatment and regimen: Secondary | ICD-10-CM | POA: Insufficient documentation

## 2018-03-20 DIAGNOSIS — F1010 Alcohol abuse, uncomplicated: Secondary | ICD-10-CM | POA: Insufficient documentation

## 2018-03-20 DIAGNOSIS — F17210 Nicotine dependence, cigarettes, uncomplicated: Secondary | ICD-10-CM | POA: Insufficient documentation

## 2018-03-20 DIAGNOSIS — F1910 Other psychoactive substance abuse, uncomplicated: Secondary | ICD-10-CM | POA: Insufficient documentation

## 2018-03-20 DIAGNOSIS — Y9389 Activity, other specified: Secondary | ICD-10-CM | POA: Insufficient documentation

## 2018-03-20 DIAGNOSIS — S01311A Laceration without foreign body of right ear, initial encounter: Secondary | ICD-10-CM | POA: Insufficient documentation

## 2018-03-20 DIAGNOSIS — X58XXXA Exposure to other specified factors, initial encounter: Secondary | ICD-10-CM | POA: Insufficient documentation

## 2018-03-20 DIAGNOSIS — S0003XA Contusion of scalp, initial encounter: Secondary | ICD-10-CM | POA: Insufficient documentation

## 2018-03-20 LAB — COMPREHENSIVE METABOLIC PANEL
ALK PHOS: 39 U/L (ref 38–126)
ALT: 16 U/L (ref 0–44)
ANION GAP: 5 (ref 5–15)
AST: 17 U/L (ref 15–41)
Albumin: 4.6 g/dL (ref 3.5–5.0)
BILIRUBIN TOTAL: 0.8 mg/dL (ref 0.3–1.2)
BUN: 10 mg/dL (ref 6–20)
CALCIUM: 9.3 mg/dL (ref 8.9–10.3)
CO2: 29 mmol/L (ref 22–32)
CREATININE: 0.93 mg/dL (ref 0.61–1.24)
Chloride: 107 mmol/L (ref 98–111)
GFR calc non Af Amer: >60 mL/min (ref >60)
GLUCOSE: 86 mg/dL (ref 70–99)
Potassium: 4.2 mmol/L (ref 3.5–5.1)
Sodium: 141 mmol/L (ref 135–145)
TOTAL PROTEIN: 7.4 g/dL (ref 6.5–8.1)

## 2018-03-20 LAB — CBC WITH DIFFERENTIAL/PLATELET
ABS IMMATURE GRANULOCYTES: 0.01 10*3/uL (ref 0.00–0.07)
BASOS ABS: 0.1 10*3/uL (ref 0.0–0.1)
BASOS PCT: 1 %
EOS ABS: 0.7 10*3/uL — AB (ref 0.0–0.5)
Eosinophils Relative: 9 %
HCT: 45.7 % (ref 39.0–52.0)
Hemoglobin: 14.7 g/dL (ref 13.0–17.0)
IMMATURE GRANULOCYTES: 0 %
Lymphocytes Relative: 39 %
Lymphs Abs: 2.7 10*3/uL (ref 0.7–4.0)
MCH: 29.6 pg (ref 26.0–34.0)
MCHC: 32.2 g/dL (ref 30.0–36.0)
MCV: 92.1 fL (ref 80.0–100.0)
MONOS PCT: 10 %
Monocytes Absolute: 0.7 10*3/uL (ref 0.1–1.0)
NEUTROS ABS: 2.9 10*3/uL (ref 1.7–7.7)
NEUTROS PCT: 41 %
NRBC: 0 % (ref 0.0–0.2)
Platelets: 285 10*3/uL (ref 150–400)
RBC: 4.96 MIL/uL (ref 4.22–5.81)
RDW: 13.5 % (ref 11.5–15.5)
WBC: 7 10*3/uL (ref 4.0–10.5)

## 2018-03-20 LAB — PROTIME-INR
INR: 1.01
Prothrombin Time: 13.2 seconds (ref 11.4–15.2)

## 2018-03-20 LAB — ETHANOL: ALCOHOL ETHYL (B): 119 mg/dL — AB (ref <10)

## 2018-03-20 LAB — RAPID URINE DRUG SCREEN, HOSP PERFORMED
Amphetamines: NOT DETECTED
BENZODIAZEPINES: POSITIVE — AB
Barbiturates: NOT DETECTED
COCAINE: NOT DETECTED
Opiates: NOT DETECTED
Tetrahydrocannabinol: POSITIVE — AB

## 2018-03-20 LAB — PHENYTOIN LEVEL, TOTAL: Phenytoin Lvl: <2.5 ug/mL — ABNORMAL LOW (ref 10.0–20.0)

## 2018-03-20 MED ORDER — ENOXAPARIN SODIUM 120 MG/0.8ML ~~LOC~~ SOLN: 120.0000 mg | SUBCUTANEOUS | 0 refills | Status: DC

## 2018-03-20 MED ORDER — PHENYTOIN SODIUM EXTENDED 100 MG PO CAPS: 300.0000 mg | ORAL_CAPSULE | Freq: Every day | ORAL | 0 refills | Status: DC

## 2018-03-20 MED ORDER — WARFARIN SODIUM 5 MG PO TABS: 7.5000 mg | ORAL_TABLET | Freq: Every day | ORAL | 0 refills | Status: DC

## 2018-03-20 MED ORDER — AMMONIA AROMATIC IN INHA
RESPIRATORY_TRACT | Status: AC
  Filled 2018-03-20: qty 10

## 2018-03-20 MED ORDER — LIDOCAINE HCL (PF) 2 % IJ SOLN
10.0000 mL | Freq: Once | INTRAMUSCULAR | Status: AC
  Administered 2018-03-20: 10 mL

## 2018-03-20 MED ORDER — SODIUM CHLORIDE 0.9 % IV SOLN
1500.0000 mg | Freq: Once | INTRAVENOUS | Status: AC
  Administered 2018-03-20: 1500 mg via INTRAVENOUS
  Filled 2018-03-20: qty 30

## 2018-03-20 MED ORDER — LAMOTRIGINE 150 MG PO TABS: 150.0000 mg | ORAL_TABLET | ORAL | 0 refills | Status: DC

## 2018-03-20 MED ORDER — LIDOCAINE HCL (PF) 2 % IJ SOLN
INTRAMUSCULAR | Status: AC
  Administered 2018-03-20: 10 mL
  Filled 2018-03-20: qty 10

## 2018-03-20 MED ORDER — QUETIAPINE FUMARATE 100 MG PO TABS: 100.0000 mg | ORAL_TABLET | Freq: Every day | ORAL | 0 refills | Status: DC
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
## 2018-03-20 NOTE — ED Notes (Signed)
Water and crackers given

## 2018-03-20 NOTE — ED Triage Notes (Signed)
Pt states he abuses narcotics/xanaxs. Pt has blue substance in nasal cavity, states snorted xanax and used heroin today. edp aware.

## 2018-03-20 NOTE — Discharge Instructions (Signed)
You need to start back on the Coumadin.  Start Lovenox for the next 3 days and Coumadin at 7.5 mg.  He will need dosing followed to keep an INR between 2 and 2.5. Have the sutures out in around 7 days.

## 2018-12-05 ENCOUNTER — Telehealth: Payer: Self-pay | Admitting: Neurology

## 2018-12-05 MED ORDER — PHENYTOIN SODIUM EXTENDED 100 MG PO CAPS: 400.0000 mg | ORAL_CAPSULE | Freq: Every day | ORAL | 0 refills | Status: DC

## 2018-12-05 NOTE — Telephone Encounter (Signed)
Rx has been submitted pending his next appt.

## 2018-12-05 NOTE — Addendum Note (Signed)
Addended by: Verlin Grills T on: 12/05/2018 10:00 AM   Modules accepted: Orders

## 2018-12-05 NOTE — Telephone Encounter (Signed)
Pt has been given 12 pills(he takes 4 a day) by the pharmacy, he would like to know if since he has scheduled his 1 yr f/u can he get enough phenytoin (DILANTIN) 100 MG ER capsule to last him to his appointment on 08-17 if so please send to [id number]
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
## 2018-12-24 ENCOUNTER — Encounter: Payer: Self-pay | Admitting: Neurology

## 2018-12-24 ENCOUNTER — Ambulatory Visit (INDEPENDENT_AMBULATORY_CARE_PROVIDER_SITE_OTHER): Payer: Self-pay | Admitting: Neurology

## 2018-12-24 ENCOUNTER — Other Ambulatory Visit: Payer: Self-pay

## 2018-12-24 VITALS — BP 142/99 | HR 77 | Temp 96.9°F | Ht 71.0 in | Wt 198.6 lb

## 2018-12-24 DIAGNOSIS — G40909 Epilepsy, unspecified, not intractable, without status epilepticus: Secondary | ICD-10-CM

## 2018-12-24 MED ORDER — LAMOTRIGINE 150 MG PO TABS: ORAL_TABLET | ORAL | 5 refills | Status: DC

## 2018-12-24 MED ORDER — PHENYTOIN SODIUM EXTENDED 100 MG PO CAPS: 400.0000 mg | ORAL_CAPSULE | Freq: Every day | ORAL | 5 refills | Status: DC

## 2018-12-25 LAB — COMPREHENSIVE METABOLIC PANEL
ALT: 21 IU/L (ref 0–44)
AST: 20 IU/L (ref 0–40)
Albumin/Globulin Ratio: 2.2 (ref 1.2–2.2)
Albumin: 4.8 g/dL (ref 4.0–5.0)
Alkaline Phosphatase: 58 IU/L (ref 39–117)
BUN/Creatinine Ratio: 16 (ref 9–20)
BUN: 18 mg/dL (ref 6–20)
Bilirubin Total: <0.2 mg/dL (ref 0.0–1.2)
CO2: 26 mmol/L (ref 20–29)
Calcium: 9.9 mg/dL (ref 8.7–10.2)
Chloride: 99 mmol/L (ref 96–106)
Creatinine, Ser: 1.12 mg/dL (ref 0.76–1.27)
GFR calc Af Amer: 99 mL/min/{1.73_m2} (ref >59)
GFR calc non Af Amer: 86 mL/min/{1.73_m2} (ref >59)
Globulin, Total: 2.2 g/dL (ref 1.5–4.5)
Glucose: 90 mg/dL (ref 65–99)
Potassium: 5.5 mmol/L — ABNORMAL HIGH (ref 3.5–5.2)
Sodium: 138 mmol/L (ref 134–144)
Total Protein: 7 g/dL (ref 6.0–8.5)

## 2018-12-25 LAB — CBC WITH DIFFERENTIAL/PLATELET
Basophils Absolute: 0 10*3/uL (ref 0.0–0.2)
Basos: 1 %
EOS (ABSOLUTE): 0.4 10*3/uL (ref 0.0–0.4)
Eos: 6 %
Hematocrit: 50.6 % (ref 37.5–51.0)
Hemoglobin: 15.9 g/dL (ref 13.0–17.7)
Immature Grans (Abs): 0 10*3/uL (ref 0.0–0.1)
Immature Granulocytes: 0 %
Lymphocytes Absolute: 2 10*3/uL (ref 0.7–3.1)
Lymphs: 26 %
MCH: 29.4 pg (ref 26.6–33.0)
MCHC: 31.4 g/dL — ABNORMAL LOW (ref 31.5–35.7)
MCV: 94 fL (ref 79–97)
Monocytes Absolute: 0.7 10*3/uL (ref 0.1–0.9)
Monocytes: 9 %
Neutrophils Absolute: 4.4 10*3/uL (ref 1.4–7.0)
Neutrophils: 58 %
Platelets: 286 10*3/uL (ref 150–450)
RBC: 5.41 x10E6/uL (ref 4.14–5.80)
RDW: 13.8 % (ref 11.6–15.4)
WBC: 7.7 10*3/uL (ref 3.4–10.8)

## 2018-12-25 LAB — LAMOTRIGINE LEVEL: Lamotrigine Lvl: 7.3 ug/mL (ref 2.0–20.0)

## 2018-12-26 ENCOUNTER — Telehealth: Payer: Self-pay | Admitting: *Deleted

## 2018-12-26 DIAGNOSIS — G40909 Epilepsy, unspecified, not intractable, without status epilepticus: Secondary | ICD-10-CM

## 2018-12-26 DIAGNOSIS — Z5181 Encounter for therapeutic drug level monitoring: Secondary | ICD-10-CM

## 2018-12-26 NOTE — Telephone Encounter (Signed)
LMVM for pt that labs look good except elevated potassium level (unknown reason), would like to recheck.  Come in at convenience to repeat.  If questions to call back.

## 2018-12-26 NOTE — Telephone Encounter (Signed)
-----   Message from Suzzanne Cloud, NP sent at 12/25/2018 12:53 PM EDT ----- Please call the patient. Labs look good, with exception of elevated potassium level of 5.5 for unknown reason. I would like to recheck this. He can come here for recheck or have done at his primary care, whichever is better for the patient.

## 2018-12-27 NOTE — Progress Notes (Signed)
I have read the note, and I agree with the clinical assessment and plan.  Ryan Fernandez   

## 2019-01-01 ENCOUNTER — Telehealth: Payer: Self-pay | Admitting: *Deleted

## 2019-01-01 NOTE — Telephone Encounter (Signed)
I called pt and relayed that his labs looked except for elevated potassium.  I relayed that SS/NP would like to recheck (this by [place] or pcp) whichever is easier.  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  He will come by here, Gave days and hours to come by, no appt needed).  He verbalized understanding.  Order is placed.
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
## 2019-06-26 ENCOUNTER — Encounter: Payer: Self-pay | Admitting: Neurology

## 2019-06-26 ENCOUNTER — Ambulatory Visit: Payer: Self-pay | Admitting: Neurology

## 2019-07-12 ENCOUNTER — Other Ambulatory Visit: Payer: Self-pay | Admitting: Neurology

## 2019-07-29 ENCOUNTER — Ambulatory Visit
Admission: EM | Admit: 2019-07-29 | Discharge: 2019-07-29 | Disposition: A | Discharge status: Home / Self Care | Payer: Self-pay | Attending: Emergency Medicine | Admitting: Emergency Medicine

## 2019-07-29 DIAGNOSIS — S61220A Laceration with foreign body of right index finger without damage to nail, initial encounter: Secondary | ICD-10-CM

## 2019-07-29 DIAGNOSIS — M79644 Pain in right finger(s): Secondary | ICD-10-CM

## 2019-07-29 MED ORDER — MUPIROCIN CALCIUM 2 % EX CREA: 1.0000 "application " | TOPICAL_CREAM | Freq: Two times a day (BID) | CUTANEOUS | 0 refills | Status: DC

## 2019-07-29 MED ORDER — IBUPROFEN 800 MG PO TABS: 800.0000 mg | ORAL_TABLET | Freq: Three times a day (TID) | ORAL | 0 refills | Status: DC

## 2019-07-29 MED ORDER — MUPIROCIN 2 % EX OINT: 1.0000 "application " | TOPICAL_OINTMENT | Freq: Two times a day (BID) | CUTANEOUS | 0 refills | Status: AC

## 2019-07-29 NOTE — ED Provider Notes (Signed)
[facility]   [id number] 07/29/19 Arrival Time: 1707  CC: LACERATION  SUBJECTIVE:  Ryan Fernandez is a 34 y.o. male who presents with a laceration that occurred the past 1 day.  Symptoms began after Cart from broken glass.  Bleeding controlled.  Currently not on blood thinners.  Denies similar symptoms in the past.  Denies fever, chills, nausea, vomiting, redness, swelling, purulent drainage, decrease strength or sensation.   Td UTD: Yes /4 years ago  ROS: As per HPI.  All other pertinent ROS negative.     Past Medical History:  Diagnosis Date  . Alcohol abuse   . Chronic insomnia 06/26/2017  . Opiate addiction (HCC)   . PE (pulmonary thromboembolism) (HCC)   . Seizures (HCC)    Past Surgical History:  Procedure Laterality Date  . MIDDLE EAR SURGERY     Allergies  Allergen Reactions  . Penicillins Other (See Comments)    Childhood allergy Has patient had a PCN reaction causing immediate rash, facial/tongue/throat swelling, SOB or lightheadedness with hypotension:Unknown Has patient had a PCN reaction causing severe rash involving mucus membranes or skin necrosis: Unknown Has patient had a PCN reaction that required hospitalization: Unknown: Has patient had a PCN reaction occurring within the last 10 years:NO  If all of the above answers are "NO", then may proceed with Cephalosporin use. .  . Tramadol Rash  . Vicodin [Hydrocodone-Acetaminophen] Rash   Current Facility-Administered Medications on File Prior to Encounter  Medication Dose Route Frequency Provider Last Rate Last Admin  . gadopentetate dimeglumine (MAGNEVIST) injection 15 mL  15 mL Intravenous Once PRN York Spaniel, MD       Current Outpatient Medications on File Prior to Encounter  Medication Sig Dispense Refill  . ALPRAZolam (XANAX) 1 MG tablet Take 1 mg by mouth 4 (four) times daily.    Marland Kitchen lamoTRIgine (LAMICTAL) 150 MG tablet 1 tablet in the morning, 2 in the evening 90 tablet 5  .  phenytoin (DILANTIN) 100 MG ER capsule TAKE 4 CAPSULES (400 MG TOTAL) BY MOUTH AT BEDTIME. 360 capsule 0   Social History   Socioeconomic History  . Marital status: Single    Spouse name: Not on file  . Number of children: Not on file  . Years of education: Not on file  . Highest education level: Not on file  Occupational History  . Not on file  Tobacco Use  . Smoking status: Current Every Day Smoker    Packs/day: 1.00    Types: Cigarettes  . Smokeless tobacco: Current User    Types: Snuff  Substance and Sexual Activity  . Alcohol use: Yes    Comment: occ  . Drug use: Yes    Types: Marijuana    Comment: snorts narcotics and xanaxs/heroin  . Sexual activity: Not on file  Other Topics Concern  . Not on file  Social History Narrative   Lives   Caffeine use:    Social Determinants of Health   Financial Resource Strain:   . Difficulty of Paying Living Expenses:   Food Insecurity:   . Worried About Programme researcher, broadcasting/film/video in the Last Year:   . Barista in the Last Year:   Transportation Needs:   . Freight forwarder (Medical):   Marland Kitchen Lack of Transportation (Non-Medical):   Physical Activity:   . Days of Exercise per Week:   . Minutes of Exercise per Session:   Stress:   . Feeling of Stress :  Social Connections:   . Frequency of Communication with Friends and Family:   . Frequency of Social Gatherings with Friends and Family:   . Attends Religious Services:   . Active Member of Clubs or Organizations:   . Attends Archivist Meetings:   Marland Kitchen Marital Status:   Intimate Partner Violence:   . Fear of Current or Ex-Partner:   . Emotionally Abused:   Marland Kitchen Physically Abused:   . Sexually Abused:    Family History  Problem Relation Age of Onset  . Diabetes Father      OBJECTIVE:  Vitals:   07/29/19 1724  BP: (!) 145/98  Pulse: 73  Resp: 18  Temp: 97.9 F (36.6 C)  SpO2: 98%     General appearance: alert; no distress Chest: Respiration  unlabored, no dyspnea or orthopnea, CTA Heart: RRR, no murmur, rub or gallop Skin: laceration of right index finger; size: approx 2 cm   07/29/19    Psychological: alert and cooperative; normal mood and affect   Results for orders placed or performed in visit on 12/24/18  CBC with Differential/Platelet  Result Value Ref Range   WBC 7.7 3.4 - 10.8 x10E3/uL   RBC 5.41 4.14 - 5.80 x10E6/uL   Hemoglobin 15.9 13.0 - 17.7 g/dL   Hematocrit 50.6 37.5 - 51.0 %   MCV 94 79 - 97 fL   MCH 29.4 26.6 - 33.0 pg   MCHC 31.4 (L) 31.5 - 35.7 g/dL   RDW 13.8 11.6 - 15.4 %   Platelets 286 150 - 450 x10E3/uL   Neutrophils 58 Not Estab. %   Lymphs 26 Not Estab. %   Monocytes 9 Not Estab. %   Eos 6 Not Estab. %   Basos 1 Not Estab. %   Neutrophils Absolute 4.4 1.4 - 7.0 x10E3/uL   Lymphocytes Absolute 2.0 0.7 - 3.1 x10E3/uL   Monocytes Absolute 0.7 0.1 - 0.9 x10E3/uL   EOS (ABSOLUTE) 0.4 0.0 - 0.4 x10E3/uL   Basophils Absolute 0.0 0.0 - 0.2 x10E3/uL   Immature Granulocytes 0 Not Estab. %   Immature Grans (Abs) 0.0 0.0 - 0.1 x10E3/uL  CMP  Result Value Ref Range   Glucose 90 65 - 99 mg/dL   BUN 18 6 - 20 mg/dL   Creatinine, Ser 1.12 0.76 - 1.27 mg/dL   GFR calc non Af Amer 86 >59 mL/min/1.73   GFR calc Af Amer 99 >59 mL/min/1.73   BUN/Creatinine Ratio 16 9 - 20   Sodium 138 134 - 144 mmol/L   Potassium 5.5 (H) 3.5 - 5.2 mmol/L   Chloride 99 96 - 106 mmol/L   CO2 26 20 - 29 mmol/L   Calcium 9.9 8.7 - 10.2 mg/dL   Total Protein 7.0 6.0 - 8.5 g/dL   Albumin 4.8 4.0 - 5.0 g/dL   Globulin, Total 2.2 1.5 - 4.5 g/dL   Albumin/Globulin Ratio 2.2 1.2 - 2.2   Bilirubin Total <0.2 0.0 - 1.2 mg/dL   Alkaline Phosphatase 58 39 - 117 IU/L   AST 20 0 - 40 IU/L   ALT 21 0 - 44 IU/L  Lamotrigine level  Result Value Ref Range   Lamotrigine Lvl 7.3 2.0 - 20.0 ug/mL    Labs Reviewed - No data to display  No results found.  Procedure: Laceration procedure not completed at this  visit  ASSESSMENT & PLAN:  1. Laceration of right index finger with foreign body without damage to nail, initial encounter   2. Finger pain,  right     Meds ordered this encounter  Medications  . ibuprofen (ADVIL) 800 MG tablet    Sig: Take 1 tablet (800 mg total) by mouth 3 (three) times daily.    Dispense:  21 tablet    Refill:  0  . DISCONTD: mupirocin cream (BACTROBAN) 2 %    Sig: Apply 1 application topically 2 (two) times daily.    Dispense:  15 g    Refill:  0  . mupirocin ointment (BACTROBAN) 2 %    Sig: Apply 1 application topically 2 (two) times daily.    Dispense:  22 g    Refill:  0    Bandage applied Keep covered  Gently clean with warm water and mild soap.  Avoid submerging wound in water. Change dressing daily and apply a thin layer of bactroban.   Take OTC ibuprofen or tylenol as needed for pain releif Return sooner or go to the ED if you have any new or worsening symptoms such as increased pain, redness, swelling, drainage, discharge, decreased range of motion of extremity, etc..     Reviewed expectations re: course of current medical issues. Questions answered. Outlined signs and symptoms indicating need for more acute intervention. Patient verbalized understanding. After Visit Summary given.   Durward Parcel, FNP 07/29/19 1805

## 2019-07-29 NOTE — Discharge Instructions (Addendum)
Bandage applied Keep covered  Gently clean with warm water and mild soap.  Avoid submerging wound in water. Change dressing daily and apply a thin layer of bactroban.   Take OTC ibuprofen or tylenol as needed for pain releif Return sooner or go to the ED if you have any new or worsening symptoms such as increased pain, redness, swelling, drainage, discharge, decreased range of motion of extremity, etc.

## 2019-07-29 NOTE — ED Triage Notes (Signed)
Pt presents with laceration to 1st finger on right hand, cut last night on piece of glass

## 2019-08-22 ENCOUNTER — Emergency Department (HOSPITAL_BASED_OUTPATIENT_CLINIC_OR_DEPARTMENT_OTHER)
Admission: EM | Admit: 2019-08-22 | Discharge: 2019-08-22 | Disposition: A | Discharge status: Home / Self Care | Payer: Self-pay | Attending: Emergency Medicine | Admitting: Emergency Medicine

## 2019-08-22 ENCOUNTER — Encounter (HOSPITAL_BASED_OUTPATIENT_CLINIC_OR_DEPARTMENT_OTHER): Payer: Self-pay | Admitting: Emergency Medicine

## 2019-08-22 ENCOUNTER — Emergency Department (HOSPITAL_BASED_OUTPATIENT_CLINIC_OR_DEPARTMENT_OTHER): Payer: Self-pay

## 2019-08-22 ENCOUNTER — Other Ambulatory Visit: Payer: Self-pay

## 2019-08-22 DIAGNOSIS — Z79899 Other long term (current) drug therapy: Secondary | ICD-10-CM | POA: Insufficient documentation

## 2019-08-22 DIAGNOSIS — N50819 Testicular pain, unspecified: Secondary | ICD-10-CM

## 2019-08-22 DIAGNOSIS — F17210 Nicotine dependence, cigarettes, uncomplicated: Secondary | ICD-10-CM | POA: Insufficient documentation

## 2019-08-22 DIAGNOSIS — Z86711 Personal history of pulmonary embolism: Secondary | ICD-10-CM | POA: Insufficient documentation

## 2019-08-22 DIAGNOSIS — R809 Proteinuria, unspecified: Secondary | ICD-10-CM

## 2019-08-22 DIAGNOSIS — Z880 Allergy status to penicillin: Secondary | ICD-10-CM | POA: Insufficient documentation

## 2019-08-22 DIAGNOSIS — Z885 Allergy status to narcotic agent status: Secondary | ICD-10-CM | POA: Insufficient documentation

## 2019-08-22 DIAGNOSIS — R102 Pelvic and perineal pain: Secondary | ICD-10-CM | POA: Insufficient documentation

## 2019-08-22 DIAGNOSIS — G40909 Epilepsy, unspecified, not intractable, without status epilepticus: Secondary | ICD-10-CM | POA: Insufficient documentation

## 2019-08-22 DIAGNOSIS — N50811 Right testicular pain: Secondary | ICD-10-CM | POA: Insufficient documentation

## 2019-08-22 LAB — CBC WITH DIFFERENTIAL/PLATELET
Abs Immature Granulocytes: 0.01 10*3/uL (ref 0.00–0.07)
Basophils Absolute: 0.1 10*3/uL (ref 0.0–0.1)
Basophils Relative: 1 %
Eosinophils Absolute: 0.3 10*3/uL (ref 0.0–0.5)
Eosinophils Relative: 4 %
HCT: 42.1 % (ref 39.0–52.0)
Hemoglobin: 13.9 g/dL (ref 13.0–17.0)
Immature Granulocytes: 0 %
Lymphocytes Relative: 32 %
Lymphs Abs: 2.2 10*3/uL (ref 0.7–4.0)
MCH: 30.3 pg (ref 26.0–34.0)
MCHC: 33 g/dL (ref 30.0–36.0)
MCV: 91.9 fL (ref 80.0–100.0)
Monocytes Absolute: 0.7 10*3/uL (ref 0.1–1.0)
Monocytes Relative: 10 %
Neutro Abs: 3.6 10*3/uL (ref 1.7–7.7)
Neutrophils Relative %: 53 %
Platelets: 281 10*3/uL (ref 150–400)
RBC: 4.58 MIL/uL (ref 4.22–5.81)
RDW: 12.9 % (ref 11.5–15.5)
WBC: 6.8 10*3/uL (ref 4.0–10.5)
nRBC: 0 % (ref 0.0–0.2)

## 2019-08-22 LAB — URINALYSIS, ROUTINE W REFLEX MICROSCOPIC
Bilirubin Urine: NEGATIVE
Glucose, UA: NEGATIVE mg/dL
Hgb urine dipstick: NEGATIVE
Ketones, ur: NEGATIVE mg/dL
Leukocytes,Ua: NEGATIVE
Nitrite: NEGATIVE
Protein, ur: 100 mg/dL — AB
Specific Gravity, Urine: >1.03 — ABNORMAL HIGH (ref 1.005–1.030)
pH: 6 (ref 5.0–8.0)

## 2019-08-22 LAB — URINALYSIS, MICROSCOPIC (REFLEX)
RBC / HPF: NONE SEEN RBC/hpf (ref 0–5)
WBC, UA: NONE SEEN WBC/hpf (ref 0–5)

## 2019-08-22 NOTE — ED Notes (Signed)
Pt to u/s

## 2019-08-22 NOTE — ED Notes (Signed)
Pt not able to provide urine at this time and RN Maggie informed

## 2019-08-22 NOTE — Discharge Instructions (Signed)
Per our discussion, please follow-up with your primary care provider regarding this visit.  You can take Tylenol and ibuprofen as needed for management of your pain.  Based on your urinalysis you were fairly dehydrated today.  Please be sure to adequately hydrate while at work.

## 2019-08-22 NOTE — ED Triage Notes (Addendum)
R testicle pain x 2 days. Also reports difficulty urinating

## 2019-08-22 NOTE — ED Provider Notes (Signed)
[facility] EMERGENCY DEPARTMENT Provider Note   CSN: [id number] Arrival date & time: 08/22/19  1532     History Chief Complaint  Patient presents with   Testicle Pain    Ryan Fernandez is a 34 y.o. male.  HPI HPI Comments: Ryan Fernandez is a 34 y.o. male with history of anxiety who presents to the Emergency Department complaining of mild suprapubic tenderness for 3 days.  He notes associated mild difficulty with urination.  No dysuria or hematuria.  He additionally notes an episode of moderate right testicular pain last night.  He denies any testicular pain currently.  He states he had intercourse yesterday afternoon but denies any just prior to the occurrence of his pain.  He denies fevers, chills, URI symptoms, chest pain, shortness of breath, nausea, vomiting, diarrhea.    Past Medical History:  Diagnosis Date   Alcohol abuse    Chronic insomnia 06/26/2017   Opiate addiction (HCC)    PE (pulmonary thromboembolism) (HCC)    Seizures (HCC)     Patient Active Problem List   Diagnosis Date Noted   Chronic insomnia 06/26/2017   Pulmonary embolus (HCC) 06/02/2017   History of opioid abuse (HCC) 06/02/2017   Bipolar disorder (HCC) 06/02/2017   TOBACCO ABUSE 08/20/2009   Elevated blood pressure reading 08/20/2009   Seizure disorder (HCC) 08/20/2009   CHEST PAIN-UNSPECIFIED 08/20/2009   SYNCOPE, HX OF 08/20/2009    Past Surgical History:  Procedure Laterality Date   MIDDLE EAR SURGERY         Family History  Problem Relation Age of Onset   Diabetes Father     Social History   Tobacco Use   Smoking status: Current Every Day Smoker    Packs/day: 1.00    Types: Cigarettes   Smokeless tobacco: Current User    Types: Snuff  Substance Use Topics   Alcohol use: Yes    Comment: occ   Drug use: Yes    Types: Marijuana    Comment: snorts narcotics and xanaxs/heroin    Home Medications Prior to Admission medications     Medication Sig Start Date End Date Taking? Authorizing Provider  ALPRAZolam Prudy Feeler) 1 MG tablet Take 1 mg by mouth 4 (four) times daily.    [provider]  ibuprofen (ADVIL) 800 MG tablet Take 1 tablet (800 mg total) by mouth 3 (three) times daily. 07/29/19   Avegno, Zachery Dakins, FNP  lamoTRIgine (LAMICTAL) 150 MG tablet 1 tablet in the morning, 2 in the evening 12/24/18   Glean Salvo, NP  mupirocin ointment (BACTROBAN) 2 % Apply 1 application topically 2 (two) times daily. 07/29/19   Avegno, Zachery Dakins, FNP  phenytoin (DILANTIN) 100 MG ER capsule TAKE 4 CAPSULES (400 MG TOTAL) BY MOUTH AT BEDTIME. 07/16/19   Glean Salvo, NP    Allergies    Penicillins, Tramadol, and Vicodin [hydrocodone-acetaminophen]  Review of Systems   Review of Systems  All other systems reviewed and are negative. Ten systems reviewed and are negative for acute change, except as noted in the HPI.   Physical Exam Updated Vital Signs BP (!) 159/88 (BP Location: Right Arm)    Pulse (!) 103    Temp 98.5 F (36.9 C) (Oral)    Resp 18    Ht 5\' 10"  (1.778 m)    Wt 72.6 kg    SpO2 98%    BMI 22.96 kg/m   Physical Exam Vitals and nursing note reviewed.  Constitutional:  General: He is not in acute distress.    Appearance: Normal appearance. He is normal weight. He is not ill-appearing, toxic-appearing or diaphoretic.  HENT:     Head: Normocephalic and atraumatic.     Right Ear: External ear normal.     Left Ear: External ear normal.     Nose: Nose normal.     Mouth/Throat:     Mouth: Mucous membranes are moist.     Pharynx: Oropharynx is clear. No oropharyngeal exudate or posterior oropharyngeal erythema.  Eyes:     General: No scleral icterus.       Right eye: No discharge.        Left eye: No discharge.     Extraocular Movements: Extraocular movements intact.     Conjunctiva/sclera: Conjunctivae normal.     Pupils: Pupils are equal, round, and reactive to light.  Cardiovascular:     Rate and  Rhythm: Normal rate and regular rhythm.     Pulses: Normal pulses.     Heart sounds: Normal heart sounds. No murmur. No friction rub. No gallop.   Pulmonary:     Effort: Pulmonary effort is normal. No respiratory distress.     Breath sounds: Normal breath sounds. No stridor. No wheezing, rhonchi or rales.  Abdominal:     General: Abdomen is flat.     Tenderness: There is abdominal tenderness (Mild suprapubic tenderness noted with deep palpation). There is no right CVA tenderness, left CVA tenderness, guarding or rebound.  Genitourinary:    Comments: Male nursing chaperone present.  Circumcised penis.  Normal-appearing penis and scrotum.  Very mild tenderness appreciated across the scrotal region.  Mild TTP noted to the right inguinal region.  No visible erythema or edema.  No signs of discharge.  No lymphadenopathy.  No inguinal hernias appreciated. Musculoskeletal:        General: Normal range of motion.     Cervical back: Normal range of motion and neck supple. No tenderness.  Skin:    General: Skin is warm and dry.     Capillary Refill: Capillary refill takes less than 2 seconds.  Neurological:     General: No focal deficit present.     Mental Status: He is alert and oriented to person, place, and time.  Psychiatric:        Mood and Affect: Mood is anxious.    ED Results / Procedures / Treatments   Labs (all labs ordered are listed, but only abnormal results are displayed) Labs Reviewed  URINALYSIS, ROUTINE W REFLEX MICROSCOPIC - Abnormal; Notable for the following components:      Result Value   Specific Gravity, Urine >1.030 (*)    Protein, ur 100 (*)    All other components within normal limits  URINALYSIS, MICROSCOPIC (REFLEX) - Abnormal; Notable for the following components:   Bacteria, UA RARE (*)    All other components within normal limits  CBC WITH DIFFERENTIAL/PLATELET    EKG None  Radiology US SCROTUM W/DOPPLER  Result Date: 08/22/2019 CLINICAL DATA:  Right  greater than left testicular pain for 2 days, initial encounter EXAM: SCROTAL ULTRASOUND DOPPLER ULTRASOUND OF THE TESTICLES TECHNIQUE: Complete ultrasound examination of the testicles, epididymis, and other scrotal structures was performed. Color and spectral Doppler ultrasound were also utilized to evaluate blood flow to the testicles. COMPARISON:  None. FINDINGS: Right testicle Measurements: 4.0 x 2.4 x 2.8 cm. No mass or microlithiasis visualized. Left testicle Measurements: 0.8 x 2.0 x 2.4 cm. No mass or microlithiasis visualized.  Right epididymis:  Normal in size and appearance. Left epididymis:  Normal in size and appearance. Hydrocele:  None visualized. Varicocele:  Small left varicocele is noted. Pulsed Doppler interrogation of both testes demonstrates normal low resistance arterial and venous waveforms bilaterally. IMPRESSION: Small left varicocele. Normal-appearing testicles. Electronically Signed   By: Alcide Clever M.D.   On: 08/22/2019 19:15    Procedures Procedures (including critical care time)  Medications Ordered in ED Medications - No data to display  ED Course  I have reviewed the triage vital signs and the nursing notes.  Pertinent labs & imaging results that were available during my care of the patient were reviewed by me and considered in my medical decision making (see chart for details).    MDM Rules/Calculators/A&P                      5:53 PM patient is a 34 year old male with a history of anxiety who presents with some mild suprapubic pain and an episode of testicular tenderness last night.  He is extremely anxious throughout the exam and was tachycardic upon arrival today.  His significant other is present she states he has "serious anxiety".  His physical exam is generally reassuring.  He does have symptoms of very mild testicular tenderness but no signs of infection or irritation.  He has some mild right inguinal pain as well.  He notes a history of "rough sex" and  notes this also occurred yesterday afternoon.  Will obtain a UA, CBC, ultrasound of the scrotum.  Will reassess.  7:24 PM ultrasound shows a mild left-sided varicocele but otherwise no acute abnormalities of the testicles.  Urinalysis shows an elevated specific gravity with some proteinuria.  I discussed continued hydration with the patient.  He states he works in [employer] and probably is not drinking a proper amount of fluids.  Also discussed following up with a primary care provider regarding the proteinuria so he can have this reevaluated.  We discussed the symptoms and treating pain with Tylenol and ibuprofen.  He understands that risky sexual behavior can cause trauma to the region and he knows to return to the emergency department with any new or worsening symptoms.  His questions were answered and he was amicable with the above plan.  Vital signs stable.  Patient discharged to home/self care.  Condition at discharge: Stable  Note: Portions of this report may have been transcribed using voice recognition software. Every effort was made to ensure accuracy; however, inadvertent computerized transcription errors may be present.    Final Clinical Impression(s) / ED Diagnoses Final diagnoses:  Pain in testicle, unspecified laterality  Proteinuria, unspecified type    Rx / DC Orders ED Discharge Orders    None       Placido Sou, PA-C 08/22/19 1952    Vanetta Mulders, MD 08/28/19 1047

## 2019-08-22 NOTE — ED Notes (Signed)
ED Provider at bedside. 

## 2019-10-07 ENCOUNTER — Encounter (HOSPITAL_BASED_OUTPATIENT_CLINIC_OR_DEPARTMENT_OTHER): Payer: Self-pay | Admitting: Emergency Medicine

## 2019-10-07 ENCOUNTER — Other Ambulatory Visit: Payer: Self-pay

## 2019-10-07 ENCOUNTER — Emergency Department (HOSPITAL_BASED_OUTPATIENT_CLINIC_OR_DEPARTMENT_OTHER)
Admission: EM | Admit: 2019-10-07 | Discharge: 2019-10-07 | Disposition: A | Discharge status: Home / Self Care | Payer: Self-pay | Attending: Emergency Medicine | Admitting: Emergency Medicine

## 2019-10-07 ENCOUNTER — Emergency Department (HOSPITAL_BASED_OUTPATIENT_CLINIC_OR_DEPARTMENT_OTHER): Payer: Self-pay

## 2019-10-07 DIAGNOSIS — Z86711 Personal history of pulmonary embolism: Secondary | ICD-10-CM | POA: Insufficient documentation

## 2019-10-07 DIAGNOSIS — R042 Hemoptysis: Secondary | ICD-10-CM | POA: Insufficient documentation

## 2019-10-07 DIAGNOSIS — F17210 Nicotine dependence, cigarettes, uncomplicated: Secondary | ICD-10-CM | POA: Insufficient documentation

## 2019-10-07 DIAGNOSIS — I10 Essential (primary) hypertension: Secondary | ICD-10-CM | POA: Insufficient documentation

## 2019-10-07 DIAGNOSIS — Z20822 Contact with and (suspected) exposure to covid-19: Secondary | ICD-10-CM | POA: Insufficient documentation

## 2019-10-07 DIAGNOSIS — Z880 Allergy status to penicillin: Secondary | ICD-10-CM | POA: Insufficient documentation

## 2019-10-07 DIAGNOSIS — Z885 Allergy status to narcotic agent status: Secondary | ICD-10-CM | POA: Insufficient documentation

## 2019-10-07 DIAGNOSIS — G40909 Epilepsy, unspecified, not intractable, without status epilepticus: Secondary | ICD-10-CM | POA: Insufficient documentation

## 2019-10-07 DIAGNOSIS — Z79899 Other long term (current) drug therapy: Secondary | ICD-10-CM | POA: Insufficient documentation

## 2019-10-07 LAB — CBC WITH DIFFERENTIAL/PLATELET
Abs Immature Granulocytes: 0.07 10*3/uL (ref 0.00–0.07)
Basophils Absolute: 0.1 10*3/uL (ref 0.0–0.1)
Basophils Relative: 0 %
Eosinophils Absolute: 0.2 10*3/uL (ref 0.0–0.5)
Eosinophils Relative: 1 %
HCT: 36.5 % — ABNORMAL LOW (ref 39.0–52.0)
Hemoglobin: 12.4 g/dL — ABNORMAL LOW (ref 13.0–17.0)
Immature Granulocytes: 0 %
Lymphocytes Relative: 12 %
Lymphs Abs: 2.2 10*3/uL (ref 0.7–4.0)
MCH: 30.4 pg (ref 26.0–34.0)
MCHC: 34 g/dL (ref 30.0–36.0)
MCV: 89.5 fL (ref 80.0–100.0)
Monocytes Absolute: 1.3 10*3/uL — ABNORMAL HIGH (ref 0.1–1.0)
Monocytes Relative: 8 %
Neutro Abs: 13.8 10*3/uL — ABNORMAL HIGH (ref 1.7–7.7)
Neutrophils Relative %: 79 %
Platelets: 247 10*3/uL (ref 150–400)
RBC: 4.08 MIL/uL — ABNORMAL LOW (ref 4.22–5.81)
RDW: 12.9 % (ref 11.5–15.5)
WBC: 17.7 10*3/uL — ABNORMAL HIGH (ref 4.0–10.5)
nRBC: 0 % (ref 0.0–0.2)

## 2019-10-07 LAB — BASIC METABOLIC PANEL
Anion gap: 9 (ref 5–15)
BUN: 12 mg/dL (ref 6–20)
CO2: 28 mmol/L (ref 22–32)
Calcium: 8.7 mg/dL — ABNORMAL LOW (ref 8.9–10.3)
Chloride: 99 mmol/L (ref 98–111)
Creatinine, Ser: 0.88 mg/dL (ref 0.61–1.24)
GFR calc Af Amer: >60 mL/min (ref >60)
GFR calc non Af Amer: >60 mL/min (ref >60)
Glucose, Bld: 109 mg/dL — ABNORMAL HIGH (ref 70–99)
Potassium: 4.1 mmol/L (ref 3.5–5.1)
Sodium: 136 mmol/L (ref 135–145)

## 2019-10-07 LAB — SARS CORONAVIRUS 2 BY RT PCR (HOSPITAL ORDER, PERFORMED IN ~~LOC~~ HOSPITAL LAB): SARS Coronavirus 2: NEGATIVE

## 2019-10-07 NOTE — ED Provider Notes (Signed)
[facility] EMERGENCY DEPARTMENT Provider Note   CSN: [id number] Arrival date & time: 10/07/19  0353     History Chief Complaint  Patient presents with  . Cough    Ryan Fernandez is a 34 y.o. male.  HPI     This is a 34 year old male with a history of hypertension, pulmonary embolism, seizures who presents with hemoptysis.  Patient reports over the last week he has had increasing congestion and cough.  He is coughing up green sputum.  However, he has noted blood streaks in his sputum today.  He has a history of PE and this concerned him.  He states that he is supposed to be on Coumadin but lost his job and does not have insurance.  Has not noted any lower extremity swelling or pain.  Denies chest pain.  Reports that he no longer drinks alcohol.  Denies drug use.  He does continue to smoke.  No known Covid exposures.  He is not vaccinated.  Past Medical History:  Diagnosis Date  . Alcohol abuse   . Chronic insomnia 06/26/2017  . Essential hypertension 06/08/2017  . Opiate addiction ([place])   . PE (pulmonary thromboembolism) ([place])   . Seizures [facility])     Patient Active Problem List   Diagnosis Date Noted  . Chronic insomnia 06/26/2017  . Essential hypertension 06/08/2017  . Pulmonary embolus ([place]) 06/02/2017  . History of opioid abuse ([place]) 06/02/2017  . Bipolar disorder ([place]) 06/02/2017  . TOBACCO ABUSE 08/20/2009  . Elevated blood pressure reading 08/20/2009  . Seizure disorder ([place]) 08/20/2009  . CHEST PAIN-UNSPECIFIED 08/20/2009  . SYNCOPE, HX OF 08/20/2009    Past Surgical History:  Procedure Laterality Date  . MIDDLE EAR SURGERY         Family History  Problem Relation Age of Onset  . Diabetes Father     Social History   Tobacco Use  . Smoking status: Current Every Day Smoker    Packs/day: 1.00    Types: Cigarettes  . Smokeless tobacco: Current User    Types: Snuff  Substance Use Topics  . Alcohol use: Yes    Comment: occ  . Drug use: Yes     Types: Marijuana    Comment: snorts narcotics and xanaxs/heroin    Home Medications Prior to Admission medications   Medication Sig Start Date End Date Taking? Authorizing Provider  phenytoin (DILANTIN) 100 MG ER capsule TAKE 4 CAPSULES (400 MG TOTAL) BY MOUTH AT BEDTIME. 07/16/19  Yes Suzzanne Cloud, NP  ALPRAZolam Duanne Moron) 1 MG tablet Take 1 mg by mouth 4 (four) times daily.    [provider]  ibuprofen (ADVIL) 800 MG tablet Take 1 tablet (800 mg total) by mouth 3 (three) times daily. 07/29/19   Avegno, Darrelyn Hillock, FNP  lamoTRIgine (LAMICTAL) 150 MG tablet 1 tablet in the morning, 2 in the evening 12/24/18   Suzzanne Cloud, NP  mupirocin ointment (BACTROBAN) 2 % Apply 1 application topically 2 (two) times daily. 07/29/19   Emerson Monte, FNP    Allergies    Penicillins, Tramadol, and Vicodin [hydrocodone-acetaminophen]  Review of Systems   Review of Systems  Constitutional: Negative for fever.  Respiratory: Positive for cough and shortness of breath.   Cardiovascular: Negative for chest pain.  Gastrointestinal: Negative for abdominal pain, nausea and vomiting.  Genitourinary: Negative for dysuria.  All other systems reviewed and are negative.   Physical Exam Updated Vital Signs BP 128/78 (BP Location: Right Arm)  Pulse (!) 105   Temp 98.7 F (37.1 C) (Oral)   Resp 18   Ht 1.778 m (5\' 10" )   Wt 74.8 kg   SpO2 100%   BMI 23.68 kg/m   Physical Exam Vitals and nursing note reviewed.  Constitutional:      Appearance: He is well-developed. He is not ill-appearing.  HENT:     Head: Normocephalic and atraumatic.     Mouth/Throat:     Mouth: Mucous membranes are dry.  Eyes:     Pupils: Pupils are equal, round, and reactive to light.  Cardiovascular:     Rate and Rhythm: Regular rhythm. Tachycardia present.     Heart sounds: Normal heart sounds. No murmur.  Pulmonary:     Effort: Pulmonary effort is normal. No respiratory distress.     Breath sounds:  Wheezing present.  Abdominal:     General: Bowel sounds are normal.     Palpations: Abdomen is soft.     Tenderness: There is no abdominal tenderness. There is no rebound.  Musculoskeletal:        General: No tenderness.     Cervical back: Neck supple.     Right lower leg: No edema.     Left lower leg: No edema.  Lymphadenopathy:     Cervical: No cervical adenopathy.  Skin:    General: Skin is warm and dry.  Neurological:     Mental Status: He is alert and oriented to person, place, and time.  Psychiatric:     Comments: Appears somewhat intoxicated or under the influence     ED Results / Procedures / Treatments   Labs (all labs ordered are listed, but only abnormal results are displayed) Labs Reviewed  CBC WITH DIFFERENTIAL/PLATELET - Abnormal; Notable for the following components:      Result Value   WBC 17.7 (*)    RBC 4.08 (*)    Hemoglobin 12.4 (*)    HCT 36.5 (*)    Neutro Abs 13.8 (*)    Monocytes Absolute 1.3 (*)    All other components within normal limits  BASIC METABOLIC PANEL - Abnormal; Notable for the following components:   Glucose, Bld 109 (*)    Calcium 8.7 (*)    All other components within normal limits  SARS CORONAVIRUS 2 BY RT PCR (HOSPITAL ORDER, PERFORMED IN [facility])    EKG None  Radiology DG Chest 2 View  Result Date: 10/07/2019 CLINICAL DATA:  Cough EXAM: CHEST - 2 VIEW COMPARISON:  June 25, 2017 FINDINGS: The heart size and mediastinal contours are within normal limits. Patchy airspace opacity seen at the right lung base. The visualized skeletal structures are unremarkable. IMPRESSION: Patchy airspace opacity at the right lung base which may be due to infectious etiology. Electronically Signed   By: June 27, 2017 M.D.   On: 10/07/2019 04:56    Procedures Procedures (including critical care time)  Medications Ordered in ED Medications - No data to display  ED Course  I have reviewed the triage vital signs and the  nursing notes.  Pertinent labs & imaging results that were available during my care of the patient were reviewed by me and considered in my medical decision making (see chart for details).    MDM Rules/Calculators/A&P                       Patient presents with cough and noted hemoptysis.  History of PE.  Denies any other  infectious symptoms.  He is overall nontoxic.  Vital signs notable for slight tachycardia at 105.  Denies any ongoing alcohol or drug use although he does appear intoxicated.  Considerations include but not limited to pneumonia, Covid, recurrent pulmonary embolism.  Given his history and noncompliance with Coumadin, he is high risk and not a candidate for D-dimer.  For this reason, lab work was obtained.  Chest x-ray obtained and concerning for possible infectious etiology.  Patient denies any fevers.  Unfortunately, prior to full evaluation, patient noted to be leaving the department.  IV was removed.  I was unable to speak the patient prior to him leaving.  Ryan Fernandez was evaluated in Emergency Department on 10/07/2019 for the symptoms described in the history of present illness. He was evaluated in the context of the global COVID-19 pandemic, which necessitated consideration that the patient might be at risk for infection with the SARS-CoV-2 virus that causes COVID-19. Institutional protocols and algorithms that pertain to the evaluation of patients at risk for COVID-19 are in a state of rapid change based on information released by regulatory bodies including the CDC and federal and state organizations. These policies and algorithms were followed during the patient's care in the ED.   Final Clinical Impression(s) / ED Diagnoses Final diagnoses:  Hemoptysis    Rx / DC Orders ED Discharge Orders    None       Donovon Micheletti, Mayer Masker, MD 10/07/19 [phone]

## 2019-10-07 NOTE — ED Notes (Signed)
Pt attempting to ambulate through ambulance bay doors. States he is leaving and is tired of being in ED. Allowed this RN to remove IV prior to leaving. Unwilling to stay despite being concerned about PE. Instructed to return to ED if he is feeling worse or call 911. Escorted out of ED by this RN.

## 2019-10-07 NOTE — ED Triage Notes (Signed)
Cough x1 week. States has been coughing up green sputum. Reports seeing blood in sputum x3 today. Stumbling back to room - states he doesn't drink.

## 2019-10-07 NOTE — ED Notes (Signed)
Pt did not tolerate COVID swab, unable to get a good quality specimen. Specimen unlikely to give accurate result.

## 2019-10-16 ENCOUNTER — Telehealth: Payer: Self-pay | Admitting: Neurology

## 2019-10-16 MED ORDER — PHENYTOIN SODIUM EXTENDED 100 MG PO CAPS: 400.0000 mg | ORAL_CAPSULE | Freq: Every day | ORAL | 0 refills | Status: DC

## 2019-10-16 NOTE — Telephone Encounter (Signed)
Pt called stating that he is on vacation and has run out of his phenytoin (DILANTIN) 100 MG ER capsule and is needing it called in to the [facility] on [address] [place], [place] [address]   [phone]

## 2019-10-16 NOTE — Telephone Encounter (Signed)
Refilled for 30 days 

## 2019-12-19 ENCOUNTER — Telehealth: Payer: Self-pay

## 2019-12-19 NOTE — Telephone Encounter (Signed)
Pt called and states he recently had a sz, would like to know if he needs to be seen  He is okay to wait Monday for call back

## 2019-12-23 NOTE — Telephone Encounter (Signed)
I called pt and he is asking about going back to work??   His last seizure is 12-2018. He states he is taking his medication Lamotrigine 150 po am and 300mg  pm, dilantin 400mg  po daily.  I made appt for pt tomorrow at 1345 forpt, his father will bring him. He states he is taking this but last fill was 10-16-19 (30 day supply)

## 2019-12-23 NOTE — Telephone Encounter (Signed)
I called pt and was not able to LMVM (VM full).

## 2019-12-23 NOTE — Telephone Encounter (Signed)
Pt returned call. Please call back when available. 

## 2019-12-24 ENCOUNTER — Ambulatory Visit: Payer: Self-pay | Admitting: Adult Health

## 2019-12-24 ENCOUNTER — Encounter: Payer: Self-pay | Admitting: Adult Health
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
## 2019-12-30 ENCOUNTER — Telehealth: Payer: Self-pay | Admitting: *Deleted

## 2019-12-30 ENCOUNTER — Encounter: Payer: Self-pay | Admitting: Internal Medicine

## 2019-12-30 NOTE — Telephone Encounter (Signed)
CALLED PATIENT, UNABLE TO LEAVE MESSAGE/ PHONE IS FULL/ PATIENT DNKA WITH CLINIC.

## 2020-05-08 ENCOUNTER — Emergency Department (HOSPITAL_COMMUNITY)
Admission: EM | Admit: 2020-05-08 | Discharge: 2020-05-08 | Disposition: A | Discharge status: Home / Self Care | Payer: Self-pay | Attending: Emergency Medicine | Admitting: Emergency Medicine

## 2020-05-08 ENCOUNTER — Encounter (HOSPITAL_COMMUNITY): Payer: Self-pay

## 2020-05-08 DIAGNOSIS — Z5321 Procedure and treatment not carried out due to patient leaving prior to being seen by health care provider: Secondary | ICD-10-CM | POA: Insufficient documentation

## 2020-05-08 DIAGNOSIS — R569 Unspecified convulsions: Secondary | ICD-10-CM

## 2020-05-08 DIAGNOSIS — G40909 Epilepsy, unspecified, not intractable, without status epilepticus: Secondary | ICD-10-CM | POA: Insufficient documentation

## 2020-05-08 DIAGNOSIS — Z9114 Patient's other noncompliance with medication regimen: Secondary | ICD-10-CM | POA: Insufficient documentation

## 2020-05-08 DIAGNOSIS — I10 Essential (primary) hypertension: Secondary | ICD-10-CM | POA: Insufficient documentation

## 2020-05-08 DIAGNOSIS — Z79899 Other long term (current) drug therapy: Secondary | ICD-10-CM | POA: Insufficient documentation

## 2020-05-08 DIAGNOSIS — F17210 Nicotine dependence, cigarettes, uncomplicated: Secondary | ICD-10-CM | POA: Insufficient documentation

## 2020-05-08 LAB — COMPREHENSIVE METABOLIC PANEL
ALT: 51 U/L — ABNORMAL HIGH (ref 0–44)
AST: 104 U/L — ABNORMAL HIGH (ref 15–41)
Albumin: 4.6 g/dL (ref 3.5–5.0)
Alkaline Phosphatase: 46 U/L (ref 38–126)
Anion gap: 11 (ref 5–15)
BUN: 24 mg/dL — ABNORMAL HIGH (ref 6–20)
CO2: 25 mmol/L (ref 22–32)
Calcium: 9.4 mg/dL (ref 8.9–10.3)
Chloride: 100 mmol/L (ref 98–111)
Creatinine, Ser: 1.29 mg/dL — ABNORMAL HIGH (ref 0.61–1.24)
GFR, Estimated: >60 mL/min (ref >60)
Glucose, Bld: 92 mg/dL (ref 70–99)
Potassium: 4 mmol/L (ref 3.5–5.1)
Sodium: 136 mmol/L (ref 135–145)
Total Bilirubin: 1.5 mg/dL — ABNORMAL HIGH (ref 0.3–1.2)
Total Protein: 7 g/dL (ref 6.5–8.1)

## 2020-05-08 LAB — CBC WITH DIFFERENTIAL/PLATELET
Abs Immature Granulocytes: 0.02 10*3/uL (ref 0.00–0.07)
Basophils Absolute: 0.1 10*3/uL (ref 0.0–0.1)
Basophils Relative: 1 %
Eosinophils Absolute: 0.3 10*3/uL (ref 0.0–0.5)
Eosinophils Relative: 3 %
HCT: 42.4 % (ref 39.0–52.0)
Hemoglobin: 13.8 g/dL (ref 13.0–17.0)
Immature Granulocytes: 0 %
Lymphocytes Relative: 24 %
Lymphs Abs: 1.8 10*3/uL (ref 0.7–4.0)
MCH: 28.8 pg (ref 26.0–34.0)
MCHC: 32.5 g/dL (ref 30.0–36.0)
MCV: 88.3 fL (ref 80.0–100.0)
Monocytes Absolute: 1 10*3/uL (ref 0.1–1.0)
Monocytes Relative: 13 %
Neutro Abs: 4.5 10*3/uL (ref 1.7–7.7)
Neutrophils Relative %: 59 %
Platelets: 292 10*3/uL (ref 150–400)
RBC: 4.8 MIL/uL (ref 4.22–5.81)
RDW: 13.2 % (ref 11.5–15.5)
WBC: 7.5 10*3/uL (ref 4.0–10.5)
nRBC: 0 % (ref 0.0–0.2)

## 2020-05-08 LAB — PROTIME-INR
INR: 1.1 (ref 0.8–1.2)
Prothrombin Time: 13.8 seconds (ref 11.4–15.2)

## 2020-05-08 LAB — CBG MONITORING, ED: Glucose-Capillary: 89 mg/dL (ref 70–99)

## 2020-05-08 LAB — PHENYTOIN LEVEL, TOTAL: Phenytoin Lvl: <2.5 ug/mL — ABNORMAL LOW (ref 10.0–20.0)

## 2020-05-08 MED ORDER — CLONAZEPAM 0.5 MG PO TABS
2.0000 mg | ORAL_TABLET | Freq: Once | ORAL | Status: AC
  Administered 2020-05-08: 2 mg via ORAL
  Filled 2020-05-08: qty 4

## 2020-05-08 MED ORDER — PHENYTOIN SODIUM EXTENDED 100 MG PO CAPS
400.0000 mg | ORAL_CAPSULE | Freq: Once | ORAL | Status: AC
  Administered 2020-05-08: 400 mg via ORAL
  Filled 2020-05-08: qty 4

## 2020-05-08 MED ORDER — SODIUM CHLORIDE 0.9 % IV BOLUS
1000.0000 mL | Freq: Once | INTRAVENOUS | Status: AC
  Administered 2020-05-08: 1000 mL via INTRAVENOUS

## 2020-05-08 MED ORDER — LAMOTRIGINE 150 MG PO TABS
150.0000 mg | ORAL_TABLET | Freq: Once | ORAL | Status: AC
  Administered 2020-05-08: 150 mg via ORAL
  Filled 2020-05-08: qty 1

## 2020-05-08 MED ORDER — PHENYTOIN SODIUM EXTENDED 100 MG PO CAPS: 400.0000 mg | ORAL_CAPSULE | Freq: Every day | ORAL | 0 refills | Status: AC

## 2020-05-08 MED ORDER — LAMOTRIGINE 150 MG PO TABS: ORAL_TABLET | ORAL | 0 refills | Status: AC

## 2020-05-08 NOTE — Progress Notes (Signed)
TOC CM received referral for assistance with medications. Pt recently released from jail. Does not have insurance. Will provide MATCH with $0 copay for meds with a one time use. Will have ED RN discuss the conditions of MATCH and provide pt a copy to take with him to the pharmacy on provided list. Pt will need to schedule an appt with [facility]. He can utilize the pharmacy at the clinic to get his meds at a discounted rate. ED provider updated. Isidoro Donning RN CCM, [facility] ED TOC CM [phone]

## 2020-05-08 NOTE — ED Triage Notes (Signed)
Pt comes via [facility], recent med changes and went to jail today and when he left this afternoon he didn't receive his medications back. Reports that he may have had a seizure this evening while walking, no injury.

## 2020-05-08 NOTE — ED Notes (Signed)
Patient request to hold IV fluids at this time as he needs to go to the bathroom .

## 2020-05-08 NOTE — ED Provider Notes (Signed)
[facility] EMERGENCY DEPARTMENT Provider Note   CSN: [id number] Arrival date & time: 05/08/20  1779     History Chief Complaint  Patient presents with  . Seizures    [name] is a 34 y.o. male.  Ryan Fernandez is a 34 y.o. male with a history of seizures, PE, hypertension, and polysubstance abuse, who presents to the ED for evaluation after he had a reported seizure. Patient reports yesterday he was arrested and was in jail briefly. Reports that they confiscated his Klonopin which was not returned home. He reports he was walking after he left the jail and thinks he had a seizure. Reports feeling is coming, has long history of seizures, was able to sit down around or hit his head. He thinks that a Emergency planning/management officer witnessed the seizure but he does not know how long it lasted or anything else that. Did not receive any medications with EMS. Patient reports that he is supposed to be taking lamotrigine and Dilantin for his seizures. Initially reports compliance aside from missing a dose yesterday and this morning, but later states that he has been taking these medications very sporadically due to difficulty affording the medications after losing his insurance. Patient states that his psychiatrist, Dr. Evelene Croon recently transitioned him to Klonopin, was previously on Xanax, but did have him still taking some Xanax. He reports that he was only carrying a small portion of this medication and the rest of it is kept at his dad's house so he still has some supply available. States he is feeling very anxious being here with racing thoughts but denies any SI or HI. Denies any fevers or recent illness, no chest pain, shortness of breath or cough. Denies abdominal pain, nausea or vomiting. No urinary symptoms. He denies alcohol or drug use recently, does have prior history, but states he has remained clean. No other aggravating or alleviating factors.  The history is provided by the patient.        Past Medical History:  Diagnosis Date  . Alcohol abuse   . Chronic insomnia 06/26/2017  . Essential hypertension 06/08/2017  . Opiate addiction (HCC)   . PE (pulmonary thromboembolism) (HCC)   . Seizures [facility])     Patient Active Problem List   Diagnosis Date Noted  . Chronic insomnia 06/26/2017  . Essential hypertension 06/08/2017  . Pulmonary embolus (HCC) 06/02/2017  . History of opioid abuse (HCC) 06/02/2017  . Bipolar disorder (HCC) 06/02/2017  . TOBACCO ABUSE 08/20/2009  . Elevated blood pressure reading 08/20/2009  . Seizure disorder (HCC) 08/20/2009  . CHEST PAIN-UNSPECIFIED 08/20/2009  . SYNCOPE, HX OF 08/20/2009    Past Surgical History:  Procedure Laterality Date  . MIDDLE EAR SURGERY         Family History  Problem Relation Age of Onset  . Diabetes Father     Social History   Tobacco Use  . Smoking status: Current Every Day Smoker    Packs/day: 1.00    Types: Cigarettes  . Smokeless tobacco: Current User    Types: Snuff  Substance Use Topics  . Alcohol use: Yes    Comment: occ  . Drug use: Yes    Types: Marijuana    Comment: snorts narcotics and xanaxs/heroin    Home Medications Prior to Admission medications   Medication Sig Start Date End Date Taking? Authorizing Provider  ALPRAZolam Prudy Feeler) 1 MG tablet Take 1 mg by mouth 4 (four) times daily.    [provider]  ibuprofen (ADVIL) 800 MG tablet Take 1 tablet (800 mg total) by mouth 3 (three) times daily. 07/29/19   Avegno, Zachery DakinsKomlanvi S, FNP  lamoTRIgine (LAMICTAL) 150 MG tablet 1 tablet in the morning, 2 in the evening 12/24/18   Glean SalvoSlack, Sarah J, NP  mupirocin ointment (BACTROBAN) 2 % Apply 1 application topically 2 (two) times daily. 07/29/19   Avegno, Zachery DakinsKomlanvi S, FNP  phenytoin (DILANTIN) 100 MG ER capsule Take 4 capsules (400 mg total) by mouth at bedtime. 10/16/19   Glean SalvoSlack, Sarah J, NP    Allergies    Penicillins, Tramadol, and Vicodin [hydrocodone-acetaminophen]  Review of  Systems   Review of Systems  Constitutional: Negative for chills and fever.  HENT: Negative.   Respiratory: Negative for cough and shortness of breath.   Cardiovascular: Negative for chest pain.  Gastrointestinal: Negative for abdominal pain, diarrhea, nausea and vomiting.  Genitourinary: Negative for dysuria and frequency.  Musculoskeletal: Negative for arthralgias and myalgias.  Skin: Negative for color change and rash.  Neurological: Positive for seizures. Negative for dizziness, syncope, facial asymmetry, speech difficulty, weakness, light-headedness, numbness and headaches.  Psychiatric/Behavioral: Negative for hallucinations and suicidal ideas. The patient is nervous/anxious.   All other systems reviewed and are negative.   Physical Exam Updated Vital Signs BP (!) 149/96   Pulse 79   Temp (!) 97.5 F (36.4 C) (Oral)   Resp 18   SpO2 100%   Physical Exam Vitals and nursing note reviewed.  Constitutional:      General: He is not in acute distress.    Appearance: Normal appearance. He is well-developed and well-nourished. He is not diaphoretic.     Comments: Patient is alert and oriented, overall well-appearing and in no acute distress.  HENT:     Head: Normocephalic and atraumatic.     Mouth/Throat:     Mouth: Oropharynx is clear and moist. Mucous membranes are moist.     Pharynx: Oropharynx is clear.  Eyes:     General:        Right eye: No discharge.        Left eye: No discharge.     Extraocular Movements: Extraocular movements intact and EOM normal.     Pupils: Pupils are equal, round, and reactive to light.  Cardiovascular:     Rate and Rhythm: Normal rate and regular rhythm.     Pulses: Normal pulses and intact distal pulses.     Heart sounds: Normal heart sounds. No murmur heard. No friction rub. No gallop.   Pulmonary:     Effort: Pulmonary effort is normal. No respiratory distress.     Breath sounds: Normal breath sounds. No wheezing or rales.      Comments: Respirations equal and unlabored, patient able to speak in full sentences, lungs clear to auscultation bilaterally  Abdominal:     General: Bowel sounds are normal. There is no distension.     Palpations: Abdomen is soft. There is no mass.     Tenderness: There is no abdominal tenderness. There is no guarding.     Comments: Abdomen soft, nondistended, nontender to palpation in all quadrants without guarding or peritoneal signs   Musculoskeletal:        General: No deformity or edema.     Cervical back: Neck supple.  Skin:    General: Skin is warm and dry.     Capillary Refill: Capillary refill takes less than 2 seconds.     Comments: Numerous tattoos noted  Neurological:  Mental Status: He is alert and oriented to person, place, and time.     Coordination: Coordination normal.     Comments: Speech is clear, able to follow commands CN III-XII intact Normal strength in upper and lower extremities bilaterally including dorsiflexion and plantar flexion, strong and equal grip strength Sensation normal to light and sharp touch Moves extremities without ataxia, coordination intact ambulatory with steady gait. No pronator drift.  Psychiatric:        Mood and Affect: Mood normal.        Behavior: Behavior normal.     ED Results / Procedures / Treatments   Labs (all labs ordered are listed, but only abnormal results are displayed) Labs Reviewed  COMPREHENSIVE METABOLIC PANEL - Abnormal; Notable for the following components:      Result Value   BUN 24 (*)    Creatinine, Ser 1.29 (*)    AST 104 (*)    ALT 51 (*)    Total Bilirubin 1.5 (*)    All other components within normal limits  PHENYTOIN LEVEL, TOTAL - Abnormal; Notable for the following components:   Phenytoin Lvl <2.5 (*)    All other components within normal limits  CBC WITH DIFFERENTIAL/PLATELET  PROTIME-INR  URINALYSIS, ROUTINE W REFLEX MICROSCOPIC  RAPID URINE DRUG SCREEN, HOSP PERFORMED  CBG MONITORING, ED     EKG None  Radiology No results found.  Procedures Procedures (including critical care time)  Medications Ordered in ED Medications  clonazePAM (KLONOPIN) tablet 2 mg (2 mg Oral Given 05/08/20 1011)  sodium chloride 0.9 % bolus 1,000 mL (0 mLs Intravenous Stopped 05/08/20 1145)  lamoTRIgine (LAMICTAL) tablet 150 mg (150 mg Oral Given 05/08/20 1143)  phenytoin (DILANTIN) ER capsule 400 mg (400 mg Oral Given 05/08/20 1143)    ED Course  I have reviewed the triage vital signs and the nursing notes.  Pertinent labs & imaging results that were available during my care of the patient were reviewed by me and considered in my medical decision making (see chart for details).    MDM Rules/Calculators/A&P                         Patient presents after reported seizure after leaving jail. Patient reports poor compliance with his lamotrigine and Dilantin, and is also on chronic benzos and reports that these were not returned to him after he was released from jail, and he did not know if that could contribute. He still has a supply of benzos at his father's house for the rest of his medications are kept. Reports due to losing his insurance he has had difficulty obtaining his seizure medications. Denies any fevers or other focal symptoms. He is alert and oriented with no focal neurologic deficits. Was able to lower himself to the ground because he has morning symptoms prior to seizures and did not hit his head, no signs of head injury on exam. Will check lab work and Dilantin level. Will give patient dose of his seizure medications and Klonopin. Discussed with patient upfront that given she had benzodiazepine prescriptions filled two days ago, and reports he still has some tablets at home he will not be prescribed these today. He expresses understanding with this.  I have independently ordered, reviewed and interpreted all labs and imaging:  CBC: No leukocytosis, normal hemoglobin CMP: No  significant electrolyte derangements, patient does have increasing creatinine, 1.29 today was previously 0.887 months ago, suspect some dehydration, will give IV  fluids. Patient also has slight transaminitis with T bili of 1.5 also. Denies alcohol use does not have any focal abdominal tenderness. Does use Tylenol, most recently took yesterday but did not exceed daily max dose. Discussed appropriate use of Tylenol. Patient will need to have recheck of LFTs but do not feel this requires further intervention today. Phenytoin level: Less than 2.5, further supports poor compliance with patient's seizure medications, likely the cause of his seizure. INR: 1.1, patient reports he was previously on Coumadin due to blood clot but has been unable to take this medication. No recurrent lower extremity swelling, chest pain or shortness of breath.  Consult placed to transitions of care to help with PCP follow-up and medications since patient has lost his insurance.  Discussed with patient that transitions of care team has arranged for him to receive a 30-day supply of his lamotrigine and Dilantin at no charge. Patient's prescriptions for Klonopin and alprazolam were just filled on 12/29 and patient states that only a small portion of these medications were confiscated and the rest is at his father's house, so I discussed with him that he will need to contact his psychiatrist for additional supply of these medications.  Patient had gone to the bathroom, but never returned, nursing staff has searched for patient it appears that he is eloped from the facility prior to receiving his medications or discussing the rest of his lab work and care. Patient was not available for me to have an AMA discussion. It is unfortunate that he left prior to receiving resources. Prescriptions have been placed on hold at the transitions of care pharmacy in case the patient returns.  He can call community health and wellness clinic for  follow-up appointment. Patient has has eloped and is nowhere to be found by nursing staff in the department.  Final Clinical Impression(s) / ED Diagnoses Final diagnoses:  Seizure (HCC)  Noncompliance with medication regimen    Rx / DC Orders ED Discharge Orders    None       Legrand Rams 05/08/20 1240    Arby Barrette, MD 05/09/20 1530

## 2020-05-08 NOTE — Progress Notes (Signed)
[facility] Medication Assistance Card  Name: KAITLYN SKOWRON (MRN): [id number] Bin: [id number] RX Group: [id number]  Discharge Date: 05/08/2020 Expiration Date:05/16/2020  (must be filled within 7 days of discharge)     Dear Federico Flake:  You have been approved to have the prescriptions written by your discharging physician filled through our [facility] (Medication Assistance Through [facility]) program. This program allows for a one-time (no refills) 34-day supply of selected medications for a low copay amount.  The copay is $0 per prescription. For instance, if you have one prescription, you will pay $0; for two prescriptions, you pay $0; for three prescriptions, you pay $0; and so on.  Only certain pharmacies are participating in this program with [facility]. You will need to select one of the pharmacies from the attached list and take your prescriptions, this letter, and your photo ID to one of the participating pharmacies.   We are excited that you are able to use the [facility] program to get your medications. These prescriptions must be filled within 7 days of hospital discharge or they will no longer be valid for the [facility] program. Should you have any problems with your prescriptions please contact your case management team member at [phone] or [phone] for [place].   Thank you,    [facility]    [facility]  [facility], [address], [place], [place] [employer], [address] [place], [place], [place] [facility] [address] [place], [place], [place] [facility],[address], [employer], [place] [employer], [address], [place], [place] [address], [address] [place], [place], [place] [employer], [address], [place], [place]  [facility] [address], [place], [place]   [address] [place], [place]  [address] [place] [place], [place], [place]   [address] [place], [place] [address] [place], [place], [place]   [address], [place], [place] [address] [place], [place]    [address] [place], [place] [address], [place], [place]   [address], [place], [place] [address], [place], [place] [facility] [address], [place], [place]   [address], [place], [place] [address], [place], [place]   [address] [place], [place], [place] [address] [place], [place]    [address] [place], [place] [address] [place] [address] [place], [place]   [address] [place], [place], [place] [address] [place], [place]             [facility]       [address] [place] [address] [place], [place], [place]    [address]003 [place] [address], [place], [place] 7[address] [place], [place], [place]                                       5005 [place], [place], [place] [address] [place], [place], [place]                                     [address], [place] [address] [place], [place], [place]  [address] [place], [place] [address] [place], [place], [place]                               [address] [place], [place] [address] [place], [place]                                         [address], [place] [address] [place], [place]    [address], [place], [place] 2816 [employer], [address], [place], [place]    [address], [place], [place] 4701 [place], [place], [place]     [address], [place], [place] [address], [place], [place]  [facility] [address] [place], [place]                                          4235 [place], [place] [address], [place], [place]                                          [address], [place], [place] 3701 [employer], [place], [place]                                           [address] [place], [place], [place] [address] [place], [place]                                    [address], [place], [place] 5727 [employer], [place], [place]                                           [address] [place], [place], [place] [address], [place], [place]                                          [address] [place], [place], [place] 3703 [place], [place], [place]                                             [address], [place], [place] [address] [place] [address], [place], [place]   [facility]ACIES   [facility], [address] [place], [place]     [facility] [address] [place], [place]    [address] [place], [place] [address] [place], [place]   [facility]  [address] [place], [place]    [address], [place], [place]   [facility]        [address], [place], [place]      [facility]  [address], [place], [place]     [address] [place], [place], [place]                                           [address], [place], [place]

## 2020-05-08 NOTE — ED Notes (Signed)
Noted patient has not returned to room at this time, zone bathroom is empty. WR and nearby areas were checked and patient is nowhere to be found. Charge nurse, off-duty [employer] notified of the incident. EDP and security notified as well.

## 2021-09-27 ENCOUNTER — Other Ambulatory Visit: Payer: Self-pay

## 2021-09-27 ENCOUNTER — Emergency Department (HOSPITAL_COMMUNITY)
Admission: EM | Admit: 2021-09-27 | Discharge: 2021-09-28 | Disposition: A | Discharge status: Home / Self Care | Payer: Self-pay | Attending: Emergency Medicine | Admitting: Emergency Medicine

## 2021-09-27 ENCOUNTER — Encounter (HOSPITAL_COMMUNITY): Payer: Self-pay

## 2021-09-27 DIAGNOSIS — W458XXA Other foreign body or object entering through skin, initial encounter: Secondary | ICD-10-CM | POA: Insufficient documentation

## 2021-09-27 DIAGNOSIS — Z23 Encounter for immunization: Secondary | ICD-10-CM | POA: Insufficient documentation

## 2021-09-27 DIAGNOSIS — S6990XA Unspecified injury of unspecified wrist, hand and finger(s), initial encounter: Secondary | ICD-10-CM

## 2021-09-27 DIAGNOSIS — S60352A Superficial foreign body of left thumb, initial encounter: Secondary | ICD-10-CM | POA: Insufficient documentation

## 2021-09-27 DIAGNOSIS — S60451A Superficial foreign body of left index finger, initial encounter: Secondary | ICD-10-CM | POA: Insufficient documentation

## 2021-09-27 MED ORDER — LIDOCAINE HCL (PF) 2 % IJ SOLN
INTRAMUSCULAR | Status: AC
  Administered 2021-09-28: 10 mL via INTRADERMAL
  Filled 2021-09-27: qty 20

## 2021-09-27 MED ORDER — LIDOCAINE HCL (PF) 2 % IJ SOLN: 10.0000 mL | Freq: Once | INTRAMUSCULAR | Status: AC

## 2021-09-27 MED ORDER — OXYCODONE-ACETAMINOPHEN 5-325 MG PO TABS
2.0000 | ORAL_TABLET | Freq: Once | ORAL | Status: AC
  Administered 2021-09-27: 2 via ORAL
  Filled 2021-09-27: qty 2

## 2021-09-27 NOTE — ED Triage Notes (Signed)
Rcems from home. Cc of having a fish hook stuck in his thumb and his pointer finger on the left hand. Pt unable to get hook out.

## 2021-09-28 ENCOUNTER — Emergency Department (HOSPITAL_COMMUNITY): Payer: Self-pay

## 2021-09-28 MED ORDER — SULFAMETHOXAZOLE-TRIMETHOPRIM 800-160 MG PO TABS
1.0000 | ORAL_TABLET | Freq: Once | ORAL | Status: AC
  Administered 2021-09-28: 1 via ORAL
  Filled 2021-09-28: qty 1

## 2021-09-28 MED ORDER — SULFAMETHOXAZOLE-TRIMETHOPRIM 800-160 MG PO TABS: 1.0000 | ORAL_TABLET | Freq: Two times a day (BID) | ORAL | 0 refills | Status: AC

## 2021-09-28 MED ORDER — TETANUS-DIPHTH-ACELL PERTUSSIS 5-2.5-18.5 LF-MCG/0.5 IM SUSY
0.5000 mL | PREFILLED_SYRINGE | Freq: Once | INTRAMUSCULAR | Status: AC
  Administered 2021-09-28: 0.5 mL via INTRAMUSCULAR
  Filled 2021-09-28: qty 0.5

## 2021-09-28 NOTE — ED Provider Notes (Signed)
[facility] EMERGENCY DEPARTMENT Provider Note   CSN: [id number] Arrival date & time: 09/27/21  2204     History  No chief complaint on file.   Ryan Fernandez is a 36 y.o. male.  Patient has a fishing lure with barbed hooks where one hook is in his left thumb and the other in left index finger. Tried at home, couldn't get it removed. Last tetanus was about 6-7 years ago.        Home Medications Prior to Admission medications   Medication Sig Start Date End Date Taking? Authorizing Provider  sulfamethoxazole-trimethoprim (BACTRIM DS) 800-160 MG tablet Take 1 tablet by mouth 2 (two) times daily for 7 days. 09/28/21 10/05/21 Yes Bray Vickerman, Barbara Cower, MD  ALPRAZolam Prudy Feeler) 1 MG tablet Take 1 mg by mouth 4 (four) times daily.    [provider]  ibuprofen (ADVIL) 800 MG tablet Take 1 tablet (800 mg total) by mouth 3 (three) times daily. Patient not taking: Reported on 05/08/2020 07/29/19   Durward Parcel, FNP  lamoTRIgine (LAMICTAL) 150 MG tablet 1 tablet in the morning, 2 in the evening 05/08/20   Dartha Lodge, PA-C  mupirocin ointment (BACTROBAN) 2 % Apply 1 application topically 2 (two) times daily. Patient not taking: Reported on 05/08/2020 07/29/19   Durward Parcel, FNP  phenytoin (DILANTIN) 100 MG ER capsule Take 4 capsules (400 mg total) by mouth at bedtime. 05/08/20   Dartha Lodge, PA-C      Allergies    Penicillins, Tramadol, and Vicodin [hydrocodone-acetaminophen]    Review of Systems   Review of Systems  Physical Exam Updated Vital Signs BP (!) 159/122 (BP Location: Right Arm)   Pulse 66   Temp 97.8 F (36.6 C) (Oral)   Resp 15   Ht 5\' 10"  (1.778 m)   Wt 75 kg   SpO2 100%   BMI 23.72 kg/m  Physical Exam Vitals and nursing note reviewed.  Constitutional:      Appearance: He is well-developed.  HENT:     Head: Normocephalic and atraumatic.  Cardiovascular:     Rate and Rhythm: Normal rate.  Pulmonary:     Effort: Pulmonary effort is normal.  No respiratory distress.  Abdominal:     General: There is no distension.  Musculoskeletal:        General: Normal range of motion.     Cervical back: Normal range of motion.  Skin:    Comments: Patient has a fishing lure with barbed hooks where one hook is in his left thumb and the other in left index finger.   Neurological:     Mental Status: He is alert.    ED Results / Procedures / Treatments   Labs (all labs ordered are listed, but only abnormal results are displayed) Labs Reviewed - No data to display  EKG None  Radiology DG Finger Index Left  Result Date: 09/28/2021 CLINICAL DATA:  Fishhook injury EXAM: LEFT INDEX FINGER 2+V COMPARISON:  None Available. FINDINGS: Frontal, oblique, lateral views of the left second digit are obtained. No acute fracture, subluxation, or dislocation. Joint spaces are well preserved. Soft tissues are unremarkable. No radiopaque foreign body. IMPRESSION: 1. No fracture or radiopaque foreign body. Electronically Signed   By: 09/30/2021 M.D.   On: 09/28/2021 00:44   DG Finger Thumb Left  Result Date: 09/28/2021 CLINICAL DATA:  Fishhook injury EXAM: LEFT THUMB 2+V COMPARISON:  None Available. FINDINGS: Frontal, oblique, and lateral views of the left thumb are  obtained. No acute fracture, subluxation, or dislocation. Soft tissues are unremarkable. No radiopaque foreign body. IMPRESSION: 1. No fracture or radiopaque foreign body. Electronically Signed   By: Sharlet Salina M.D.   On: 09/28/2021 00:43    Procedures .Foreign Body Removal  Date/Time: 09/28/2021 4:47 AM Performed by: Marily Memos, MD Authorized by: Marily Memos, MD  Consent: Verbal consent obtained. Risks and benefits: risks, benefits and alternatives were discussed Consent given by: patient Patient understanding: patient states understanding of the procedure being performed Anesthesia: local infiltration  Anesthesia: Local Anesthetic: lidocaine 2% without  epinephrine Anesthetic total: 2 mL  Sedation: Patient sedated: no  Patient restrained: no Patient cooperative: yes Complexity: simple 1 objects recovered. Objects recovered: fishing lure Post-procedure assessment: foreign body removed Patient tolerance: patient tolerated the procedure well with no immediate complications     Medications Ordered in ED Medications  oxyCODONE-acetaminophen (PERCOCET/ROXICET) 5-325 MG per tablet 2 tablet (2 tablets Oral Given 09/27/21 2335)  lidocaine HCl (PF) (XYLOCAINE) 2 % injection 10 mL (10 mLs Intradermal Given by Other 09/28/21 0130)  Tdap (BOOSTRIX) injection 0.5 mL (0.5 mLs Intramuscular Given 09/28/21 0127)  sulfamethoxazole-trimethoprim (BACTRIM DS) 800-160 MG per tablet 1 tablet (1 tablet Oral Given 09/28/21 0139)    ED Course/ Medical Decision Making/ A&P                           Medical Decision Making Amount and/or Complexity of Data Reviewed Radiology: ordered.  Risk Prescription drug management.   Lure removed. Abx given. Tdap updated. XR without retained foreign body or bone injury. Apparently patient left without Rx.    Final Clinical Impression(s) / ED Diagnoses Final diagnoses:  Fish hook in finger    Rx / DC Orders ED Discharge Orders          Ordered    sulfamethoxazole-trimethoprim (BACTRIM DS) 800-160 MG tablet  2 times daily        09/28/21 0128              Sabryn Preslar, Barbara Cower, MD 09/28/21 [phone]

## 2022-05-18 ENCOUNTER — Encounter (HOSPITAL_COMMUNITY): Payer: Self-pay | Admitting: *Deleted

## 2022-05-18 ENCOUNTER — Other Ambulatory Visit: Payer: Self-pay

## 2022-05-18 ENCOUNTER — Emergency Department (HOSPITAL_COMMUNITY): Payer: Self-pay

## 2022-05-18 ENCOUNTER — Emergency Department (HOSPITAL_COMMUNITY)
Admission: EM | Admit: 2022-05-18 | Discharge: 2022-05-18 | Disposition: A | Discharge status: Home / Self Care | Payer: Self-pay | Attending: Emergency Medicine | Admitting: Emergency Medicine

## 2022-05-18 DIAGNOSIS — R0789 Other chest pain: Secondary | ICD-10-CM | POA: Insufficient documentation

## 2022-05-18 DIAGNOSIS — Z1152 Encounter for screening for COVID-19: Secondary | ICD-10-CM | POA: Insufficient documentation

## 2022-05-18 LAB — CBC
HCT: 40.4 % (ref 39.0–52.0)
Hemoglobin: 12.9 g/dL — ABNORMAL LOW (ref 13.0–17.0)
MCH: 29.3 pg (ref 26.0–34.0)
MCHC: 31.9 g/dL (ref 30.0–36.0)
MCV: 91.6 fL (ref 80.0–100.0)
Platelets: 274 10*3/uL (ref 150–400)
RBC: 4.41 MIL/uL (ref 4.22–5.81)
RDW: 15.2 % (ref 11.5–15.5)
WBC: 10.9 10*3/uL — ABNORMAL HIGH (ref 4.0–10.5)
nRBC: 0 % (ref 0.0–0.2)

## 2022-05-18 LAB — BASIC METABOLIC PANEL
Anion gap: 6 (ref 5–15)
BUN: 13 mg/dL (ref 6–20)
CO2: 29 mmol/L (ref 22–32)
Calcium: 9.1 mg/dL (ref 8.9–10.3)
Chloride: 102 mmol/L (ref 98–111)
Creatinine, Ser: 1.16 mg/dL (ref 0.61–1.24)
GFR, Estimated: >60 mL/min (ref >60)
Glucose, Bld: 72 mg/dL (ref 70–99)
Potassium: 4.4 mmol/L (ref 3.5–5.1)
Sodium: 137 mmol/L (ref 135–145)

## 2022-05-18 LAB — RESP PANEL BY RT-PCR (RSV, FLU A&B, COVID)  RVPGX2
Influenza A by PCR: NEGATIVE
Influenza B by PCR: NEGATIVE
Resp Syncytial Virus by PCR: NEGATIVE
SARS Coronavirus 2 by RT PCR: NEGATIVE

## 2022-05-18 LAB — TROPONIN I (HIGH SENSITIVITY)
Troponin I (High Sensitivity): 6 ng/L (ref <18)
Troponin I (High Sensitivity): 6 ng/L (ref <18)

## 2022-05-18 LAB — D-DIMER, QUANTITATIVE: D-Dimer, Quant: <0.27 ug/mL-FEU (ref 0.00–0.50)

## 2022-05-18 MED ORDER — IBUPROFEN 600 MG PO TABS: 600.0000 mg | ORAL_TABLET | Freq: Three times a day (TID) | ORAL | 0 refills | Status: AC | PRN

## 2022-05-18 MED ORDER — KETOROLAC TROMETHAMINE 15 MG/ML IJ SOLN
15.0000 mg | Freq: Once | INTRAMUSCULAR | Status: AC
  Administered 2022-05-18: 15 mg via INTRAMUSCULAR
  Filled 2022-05-18: qty 1

## 2022-05-18 NOTE — Discharge Instructions (Addendum)
Thank you for letting us take care of you today.  As discussed, your blood work showed a negative d-dimer (a test we use to evaluate your risk for blood clots), negative troponins (an enzyme we measure to see if there is damage to your heart, and normal electrolytes, kidney function, and blood counts. Your chest x-ray and EKG were also normal. Your swabs for COVID, flu, and RSV were pending at time of discharge. Please follow up on these results via the MyChart app.  Your chest pain is worse with breathing, coughing, and touching the chest and is consistent with a strain of the muscles of the chest. This is likely due to the cough and congestion you have had over the last couple of days. I recommend you take ibuprofen as prescribed and/or Tylenol at home to manage your pain. You may also benefit from the use of a heating pad or over the counter cold medications to manage your cough and congestion.  Should you develop worsening chest pain or other symptoms including fever, shortness of breath, vomiting, getting really sweaty, pain that radiates to your neck, jaw, or down your arms, or other concerns, please be re-evaluated at the nearest emergency department.

## 2022-05-18 NOTE — ED Notes (Signed)
Pt initially declines Respiratory panel because he would like to leave and "God will take care of it". Arguing between him and male visitor occurred and patient then agreed to Respiratory swab. Ryan Fernandez

## 2022-05-18 NOTE — ED Notes (Signed)
Pt ambulated to restroom with steady gait. Yaiden Yang Alexcis Bicking,RN 

## 2022-05-18 NOTE — ED Triage Notes (Signed)
Pt states left sided chest pressure woke pt up at 0300 this morning.  Denies SOB, hurts to take a deep breath.  Denies any N/V

## 2022-05-18 NOTE — ED Provider Notes (Signed)
[facility] EMERGENCY DEPARTMENT Provider Note   CSN: [id number] Arrival date & time: 05/18/22  1414     History  Chief Complaint  Patient presents with   Chest Pain    Ryan Fernandez is a 37 y.o. male with past medical history of mood order, seizure disorder, pulmonary embolism who presents to the ED complaining of left-sided chest pain that began at 3 AM this morning.  Patient denies nausea, vomiting, diaphoresis, radiating pain, shortness of breath, abdominal pain, diarrhea, headache, or any other associated symptoms.  Patient reports that he had a pulmonary embolism in 2018 that required hospitalization and was discharged home on anticoagulants, however, patient discontinued this medication on his own accord.  He reports that he only currently takes medication for his mood disorder. He denies chest injury, recent change in activity, or IV drug use. He reports that he smokes marijuana and tobacco daily but repeatedly denies any other illicit drug use. He states that he has had a cough and congestion for the last 2 days and that his chest pain is worse with coughing, deep breathing, palpation of the chest.  Reports that he has had similar symptoms in the past that have been due to muscle strains.  Father has also been ill with similar symptoms. He denies recent travel, recent surgery, hormone use, leg pain or leg swelling, or history of malignancy.       Home Medications Prior to Admission medications   Medication Sig Start Date End Date Taking? Authorizing Provider  ibuprofen (ADVIL) 600 MG tablet Take 1 tablet (600 mg total) by mouth every 8 (eight) hours as needed for up to 5 days for moderate pain or mild pain. 05/18/22 05/23/22 Yes Derk Doubek L, PA-C  ALPRAZolam (XANAX) 1 MG tablet Take 1 mg by mouth 4 (four) times daily.    [provider]  lamoTRIgine (LAMICTAL) 150 MG tablet 1 tablet in the morning, 2 in the evening 05/08/20   Dartha Lodge, PA-C  mupirocin ointment  (BACTROBAN) 2 % Apply 1 application topically 2 (two) times daily. Patient not taking: Reported on 05/08/2020 07/29/19   Durward Parcel, FNP  phenytoin (DILANTIN) 100 MG ER capsule Take 4 capsules (400 mg total) by mouth at bedtime. 05/08/20   Dartha Lodge, PA-C      Allergies    Penicillins, Tramadol, and Vicodin [hydrocodone-acetaminophen]    Review of Systems   Review of Systems  Constitutional:  Negative for activity change, appetite change, chills, diaphoresis and fever.  HENT:  Positive for congestion. Negative for ear pain, sore throat, trouble swallowing and voice change.   Eyes:  Negative for photophobia, pain and visual disturbance.  Respiratory:  Positive for cough. Negative for apnea, choking, shortness of breath and wheezing.   Cardiovascular:  Positive for chest pain. Negative for palpitations and leg swelling.  Gastrointestinal:  Negative for abdominal distention, abdominal pain, constipation, diarrhea, nausea and vomiting.  Genitourinary:  Negative for dysuria and hematuria.  Musculoskeletal:  Negative for arthralgias, back pain, neck pain and neck stiffness.  Skin:  Negative for color change and rash.  Neurological:  Negative for dizziness, seizures, syncope, facial asymmetry, speech difficulty, weakness, light-headedness, numbness and headaches.  Psychiatric/Behavioral:  Negative for confusion.   All other systems reviewed and are negative.   Physical Exam Updated Vital Signs BP 116/82 (BP Location: Right Arm)   Pulse 92   Temp 98.3 F (36.8 C) (Oral)   Resp 20   Ht 5\' 11"  (1.803 m)  Wt 83.5 kg   SpO2 98%   BMI 25.66 kg/m  Physical Exam Vitals and nursing note reviewed.  Constitutional:      General: He is not in acute distress.    Appearance: Normal appearance. He is not ill-appearing or toxic-appearing.  HENT:     Head: Normocephalic and atraumatic.     Mouth/Throat:     Mouth: Mucous membranes are moist.  Eyes:     Conjunctiva/sclera:  Conjunctivae normal.  Cardiovascular:     Rate and Rhythm: Normal rate and regular rhythm.     Heart sounds: Normal heart sounds. No murmur heard.    No S3 or S4 sounds.  Pulmonary:     Effort: Pulmonary effort is normal.     Breath sounds: Normal breath sounds. No decreased breath sounds, wheezing, rhonchi or rales.  Chest:     Chest wall: Tenderness (mild to diffuse left chest, reproduces complaint) present. No deformity or crepitus.  Abdominal:     General: Abdomen is flat.     Palpations: Abdomen is soft.     Tenderness: There is no abdominal tenderness. There is no guarding or rebound.  Musculoskeletal:        General: Normal range of motion.     Cervical back: Neck supple.     Right lower leg: No tenderness. No edema.     Left lower leg: No tenderness. No edema.  Skin:    General: Skin is warm and dry.     Capillary Refill: Capillary refill takes less than 2 seconds.  Neurological:     General: No focal deficit present.     Mental Status: He is alert. Mental status is at baseline.  Psychiatric:        Mood and Affect: Mood normal.        Behavior: Behavior normal.     ED Results / Procedures / Treatments   Labs (all labs ordered are listed, but only abnormal results are displayed) Labs Reviewed  CBC - Abnormal; Notable for the following components:      Result Value   WBC 10.9 (*)    Hemoglobin 12.9 (*)    All other components within normal limits  RESP PANEL BY RT-PCR (RSV, FLU A&B, COVID)  [id number]  BASIC METABOLIC PANEL  D-DIMER, QUANTITATIVE (NOT AT [facility])  TROPONIN I (HIGH SENSITIVITY)  TROPONIN I (HIGH SENSITIVITY)    EKG EKG Interpretation  Date/Time:  Wednesday May 18 2022 14:24:48 EST Ventricular Rate:  86 PR Interval:  164 QRS Duration: 92 QT Interval:  352 QTC Calculation: 421 R Axis:   80 Text Interpretation: Normal sinus rhythm Normal ECG When compared with ECG of 25-Jun-2017 14:59, PREVIOUS ECG IS PRESENT No significant change since last  tracing Confirmed by Isla Pence [phone]) on 05/18/2022 3:02:17 PM  Radiology DG Chest 2 View  Result Date: 05/18/2022 CLINICAL DATA:  Chest pain EXAM: CHEST - 2 VIEW COMPARISON:  10/07/2019 FINDINGS: The heart size and mediastinal contours are within normal limits. No focal airspace consolidation, pleural effusion, or pneumothorax. Remote mild superior endplate compression deformity at T8, unchanged. IMPRESSION: No active cardiopulmonary disease. Electronically Signed   By: Davina Poke D.O.   On: 05/18/2022 14:55    Procedures None  Medications Ordered in ED Medications  ketorolac (TORADOL) 15 MG/ML injection 15 mg (15 mg Intramuscular Given 05/18/22 1622)    ED Course/ Medical Decision Making/ A&P  Medical Decision Making Amount and/or Complexity of Data Reviewed Labs: ordered. Decision-making details documented in ED Course. Radiology: ordered. Decision-making details documented in ED Course. ECG/medicine tests: ordered. Decision-making details documented in ED Course.  Risk Prescription drug management.  HEART Score for Major Cardiac Events from [email or web address] ** All calculations should be rechecked by clinician prior to use **  RESULT SUMMARY: 1 points Low Score (0-3 points)  Risk of MACE of 0.9-1.7%.   INPUTS: History --> 0 = Slightly suspicious EKG --> 0 = Normal Age --> 0 = <45 Risk factors --> 1 = 1-2 risk factors Initial troponin --> 0 = ?normal limit  Wells' Criteria for Pulmonary Embolism from [email or web address]  ** All calculations should be rechecked by clinician prior to use **  RESULT SUMMARY: 1.5 points Low risk group: 1.3% chance of PE in an ED population.   Another study assigned scores ? 4 as "PE Unlikely" and had a 3% incidence of PE.   INPUTS: Clinical signs and symptoms of DVT --> 0 = No PE is #1 diagnosis OR equally likely --> 0 = No Heart rate > 100 --> 0 = No Immobilization at least 3 days OR surgery in the  previous 4 weeks --> 0 = No Previous, objectively diagnosed PE or DVT --> 1.5 = Yes Hemoptysis --> 0 = No Malignancy w/ treatment within 6 months or palliative --> 0 = No     This is a 37 year old male presenting with chest wall pain that is reproducible on examination and described as pleuritic in nature.  He has had some associated upper respiratory symptoms and he reports being very active at his place of employment.  Low suspicion of ACS with reproducible chest pain, patient presentation, normal vital signs, and EKG without any acute changes.  However, patient and father report concerns of this to proceed with a chest pain workup.  Patient does have a history of PE and is off anticoagulants on his own accord thus I ordered a D-dimer to further assess for risk of PE and this was negative.  Patient has had no other recent risk factors, no leg pain or leg swelling, and is not tachycardic or hypoxic on exam.  Chest x-ray without evidence of pneumonia.  Viral swabs also negative for COVID, influenza, and RSV.  Troponin negative x 2.  Very mild leukocytosis but otherwise blood work unremarkable.  Pain significantly improved after Toradol. Vital signs remain unremarkable with normal heart rate and oxygen saturation on re-examination. Pt expressed desire to be discharged home following improvement in symptoms and suspicion that pain is likely related to a muscle strain of his chest.  As patient's pain is reproducible and otherwise physical exam and workup unremarkable, I believe this is reasonable and we will proceed with discharging patient home.  Discussed recommendation that patient take NSAIDs as prescribed to manage pain as well as avoid any strenuous activity.  Given strict return precautions for worsening chest pain, shortness of breath, high fevers, inability to tolerate p.o. intake, leg pain or leg swelling, or other concerns.  Patient expressed gratitude for care he received during visit today and  assures me that should he get worse or develop any new symptoms he will be reevaluated.  All patient and father's questions answered prior to discharge.  Patient stable at time of discharge.         Final Clinical Impression(s) / ED Diagnoses Final diagnoses:  Chest wall pain    Rx / DC Orders ED Discharge Orders  Ordered    ibuprofen (ADVIL) 600 MG tablet  Every 8 hours PRN        05/18/22 1716              Turner Daniels 05/19/22 2024    Isla Pence, MD 05/20/22 [phone]

## 2022-05-18 NOTE — ED Notes (Signed)
Pt left without this RN giving him DC papers. When this RN asked if he would let me get them he states " nah, I'm out" and salutes this Therapist, sports. Bryson Corona Edd Fabian

## 2024-01-05 IMAGING — DX DG FINGER INDEX 2+V*L*
3 series · 3 of 3 positions shown · non-contrast
Comparison: None Available.

CLINICAL DATA: Fishhook injury

EXAM:
LEFT INDEX FINGER 2+V

[finger ap]
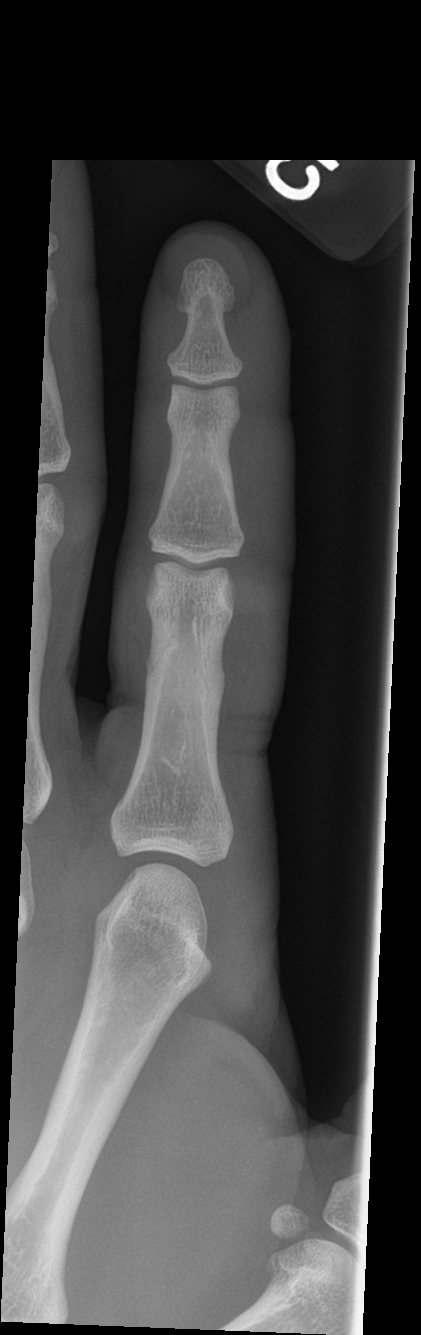

[finger obl]
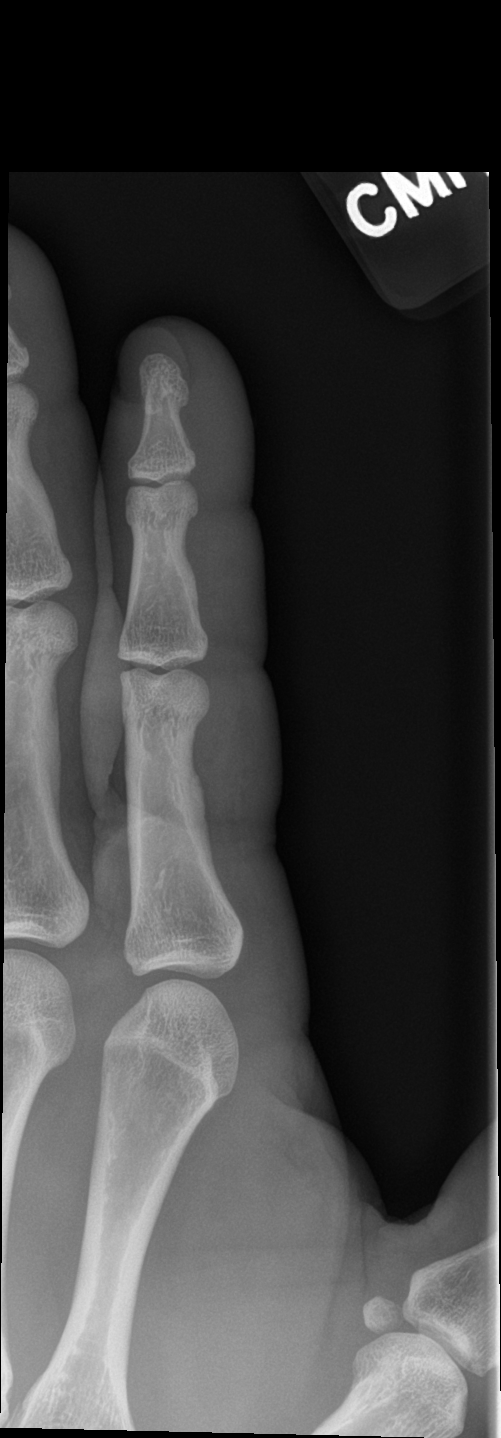

[finger lat]
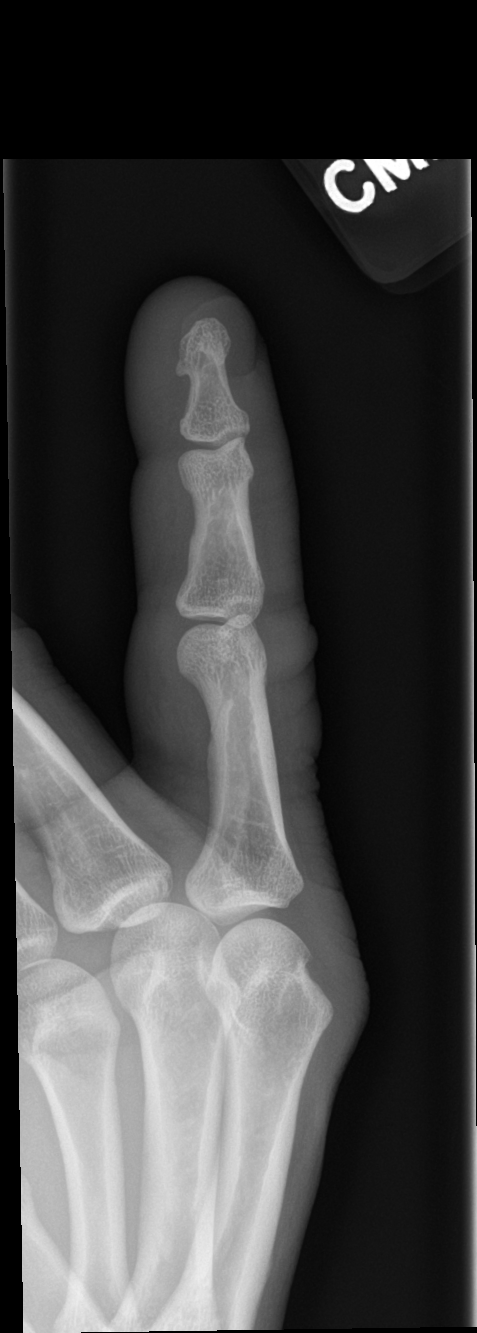

[3 of 3 positions shown; findings below may reference images not displayed]

FINDINGS: Frontal, oblique, lateral views of the left second digit are
obtained. No acute fracture, subluxation, or dislocation. Joint
spaces are well preserved. Soft tissues are unremarkable. No
radiopaque foreign body.
IMPRESSION: 1. No fracture or radiopaque foreign body.

## 2024-01-05 IMAGING — DX DG FINGER THUMB 2+V*L*
3 series · 3 of 3 positions shown · non-contrast
Comparison: None Available.

CLINICAL DATA: Fishhook injury

EXAM:
LEFT THUMB 2+V

[finger ap]
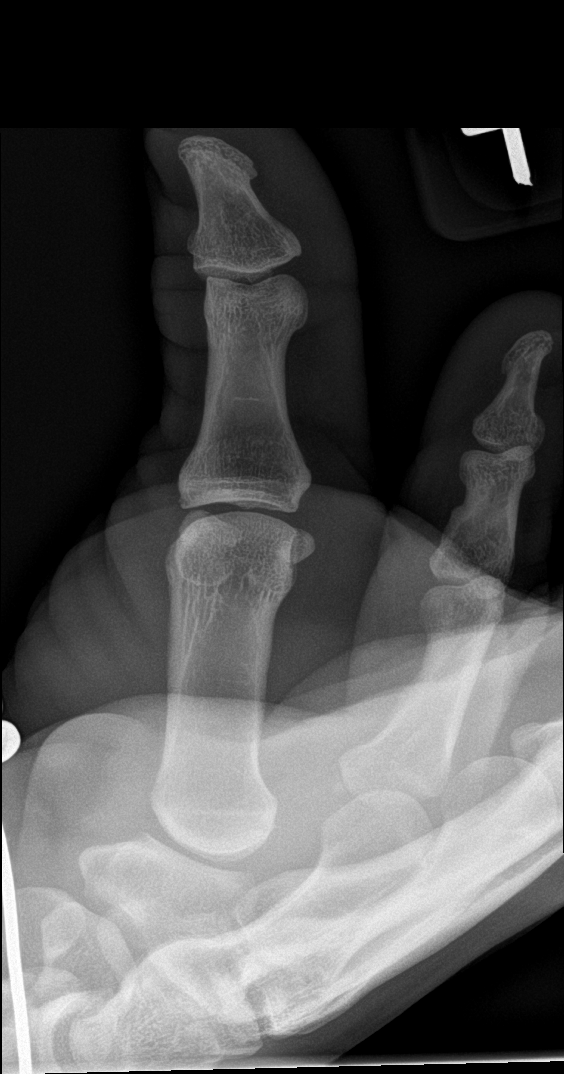

[finger obl]
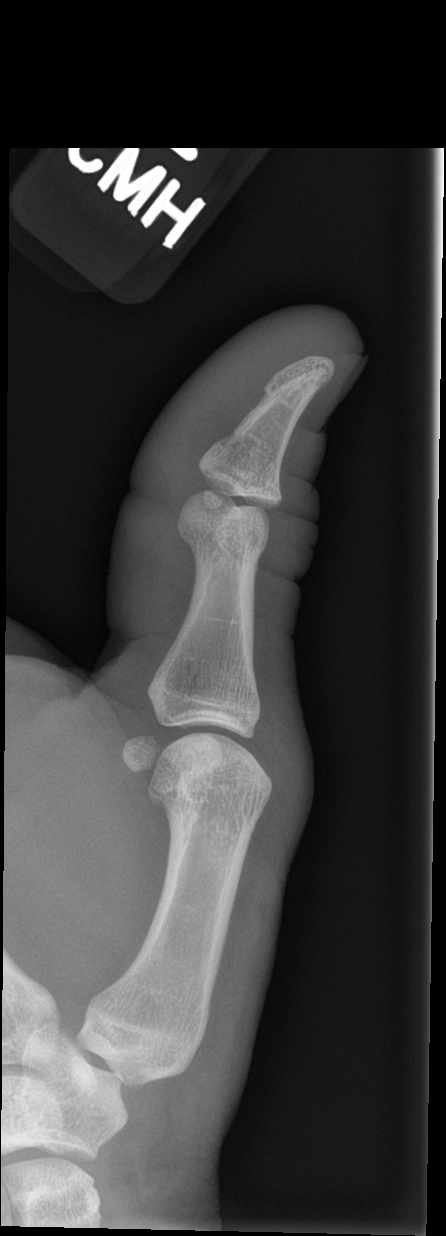

[finger lat]
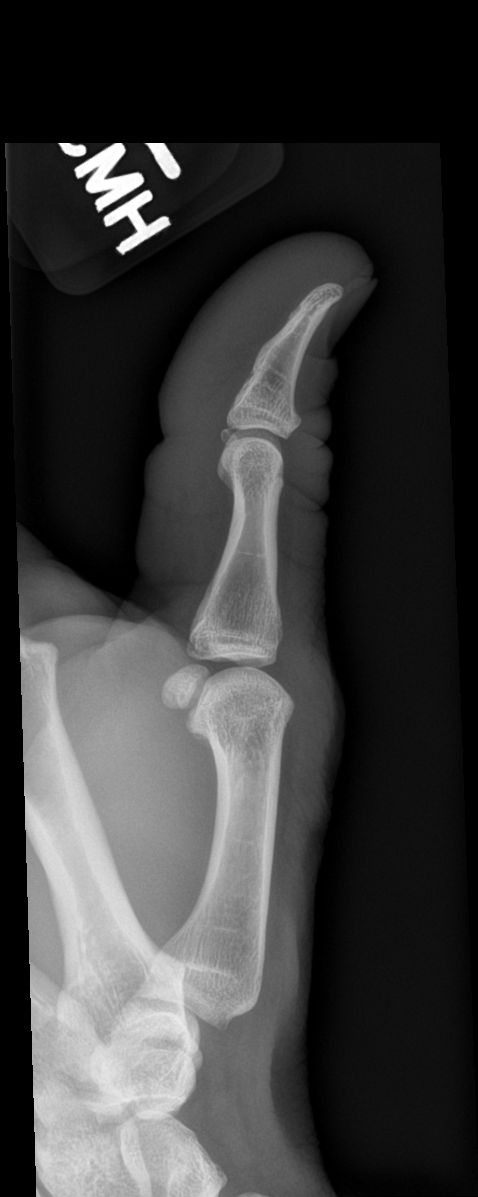

[3 of 3 positions shown; findings below may reference images not displayed]

FINDINGS: Frontal, oblique, and lateral views of the left thumb are obtained.
No acute fracture, subluxation, or dislocation. Soft tissues are
unremarkable. No radiopaque foreign body.
IMPRESSION: 1. No fracture or radiopaque foreign body.
